# Patient Record
Sex: Female | Born: 1950
Health system: Southern US, Community
[De-identification: ages and names within clinical notes are randomized; demographics above are authoritative.]

## PROBLEM LIST (undated history)

## (undated) DIAGNOSIS — K219 Gastro-esophageal reflux disease without esophagitis: Secondary | ICD-10-CM

## (undated) DIAGNOSIS — E119 Type 2 diabetes mellitus without complications: Secondary | ICD-10-CM

## (undated) DIAGNOSIS — N200 Calculus of kidney: Secondary | ICD-10-CM

## (undated) DIAGNOSIS — D649 Anemia, unspecified: Secondary | ICD-10-CM

## (undated) DIAGNOSIS — E785 Hyperlipidemia, unspecified: Secondary | ICD-10-CM

## (undated) DIAGNOSIS — J45909 Unspecified asthma, uncomplicated: Secondary | ICD-10-CM

## (undated) DIAGNOSIS — I10 Essential (primary) hypertension: Secondary | ICD-10-CM

## (undated) HISTORY — DX: Unspecified asthma, uncomplicated: J45.909

## (undated) HISTORY — DX: Gastro-esophageal reflux disease without esophagitis: K21.9

## (undated) HISTORY — PX: ABDOMINAL HYSTERECTOMY: SHX81

## (undated) HISTORY — DX: Anemia, unspecified: D64.9

## (undated) HISTORY — PX: TUBAL LIGATION: SHX77

## (undated) HISTORY — DX: Essential (primary) hypertension: I10

## (undated) HISTORY — PX: TONSILLECTOMY: SUR1361

## (undated) HISTORY — DX: Hyperlipidemia, unspecified: E78.5

## (undated) HISTORY — DX: Type 2 diabetes mellitus without complications: E11.9

---

## 1998-06-07 ENCOUNTER — Ambulatory Visit (HOSPITAL_COMMUNITY): Admission: RE | Admit: 1998-06-07 | Discharge: 1998-06-07 | Payer: Self-pay | Admitting: Family Medicine

## 2004-05-10 ENCOUNTER — Other Ambulatory Visit: Admission: RE | Admit: 2004-05-10 | Discharge: 2004-05-10 | Payer: Self-pay | Admitting: Obstetrics and Gynecology

## 2004-05-17 ENCOUNTER — Encounter: Admission: RE | Admit: 2004-05-17 | Discharge: 2004-05-17 | Payer: Self-pay | Admitting: Family Medicine

## 2012-01-22 ENCOUNTER — Encounter: Payer: Self-pay | Admitting: *Deleted

## 2013-09-30 ENCOUNTER — Emergency Department (HOSPITAL_COMMUNITY): Payer: BC Managed Care – PPO

## 2013-09-30 ENCOUNTER — Encounter (HOSPITAL_COMMUNITY): Payer: Self-pay | Admitting: Emergency Medicine

## 2013-09-30 ENCOUNTER — Emergency Department (HOSPITAL_COMMUNITY)
Admission: EM | Admit: 2013-09-30 | Discharge: 2013-10-01 | Disposition: A | Discharge status: Home / Self Care | Payer: BC Managed Care – PPO | Attending: Emergency Medicine | Admitting: Emergency Medicine

## 2013-09-30 DIAGNOSIS — Z79899 Other long term (current) drug therapy: Secondary | ICD-10-CM | POA: Insufficient documentation

## 2013-09-30 DIAGNOSIS — Y9389 Activity, other specified: Secondary | ICD-10-CM | POA: Insufficient documentation

## 2013-09-30 DIAGNOSIS — S0083XA Contusion of other part of head, initial encounter: Secondary | ICD-10-CM | POA: Insufficient documentation

## 2013-09-30 DIAGNOSIS — Z23 Encounter for immunization: Secondary | ICD-10-CM | POA: Insufficient documentation

## 2013-09-30 DIAGNOSIS — S1093XA Contusion of unspecified part of neck, initial encounter: Secondary | ICD-10-CM

## 2013-09-30 DIAGNOSIS — S060X9A Concussion with loss of consciousness of unspecified duration, initial encounter: Secondary | ICD-10-CM | POA: Insufficient documentation

## 2013-09-30 DIAGNOSIS — T07XXXA Unspecified multiple injuries, initial encounter: Secondary | ICD-10-CM

## 2013-09-30 DIAGNOSIS — S40029A Contusion of unspecified upper arm, initial encounter: Secondary | ICD-10-CM | POA: Insufficient documentation

## 2013-09-30 DIAGNOSIS — S8010XA Contusion of unspecified lower leg, initial encounter: Secondary | ICD-10-CM | POA: Insufficient documentation

## 2013-09-30 DIAGNOSIS — S0003XA Contusion of scalp, initial encounter: Secondary | ICD-10-CM | POA: Insufficient documentation

## 2013-09-30 DIAGNOSIS — Y929 Unspecified place or not applicable: Secondary | ICD-10-CM | POA: Insufficient documentation

## 2013-09-30 DIAGNOSIS — S51809A Unspecified open wound of unspecified forearm, initial encounter: Secondary | ICD-10-CM | POA: Insufficient documentation

## 2013-09-30 LAB — CBC WITH DIFFERENTIAL/PLATELET
BASOS ABS: 0 10*3/uL (ref 0.0–0.1)
Basophils Relative: 0 % (ref 0–1)
Eosinophils Absolute: 0 10*3/uL (ref 0.0–0.7)
Eosinophils Relative: 0 % (ref 0–5)
HCT: 39.3 % (ref 36.0–46.0)
HEMOGLOBIN: 12.8 g/dL (ref 12.0–15.0)
LYMPHS ABS: 1 10*3/uL (ref 0.7–4.0)
LYMPHS PCT: 7 % — AB (ref 12–46)
MCH: 28.5 pg (ref 26.0–34.0)
MCHC: 32.6 g/dL (ref 30.0–36.0)
MCV: 87.5 fL (ref 78.0–100.0)
MONOS PCT: 7 % (ref 3–12)
Monocytes Absolute: 1 10*3/uL (ref 0.1–1.0)
NEUTROS ABS: 13 10*3/uL — AB (ref 1.7–7.7)
NEUTROS PCT: 86 % — AB (ref 43–77)
PLATELETS: 227 10*3/uL (ref 150–400)
RBC: 4.49 MIL/uL (ref 3.87–5.11)
RDW: 13.6 % (ref 11.5–15.5)
WBC: 15 10*3/uL — AB (ref 4.0–10.5)

## 2013-09-30 MED ORDER — TETANUS-DIPHTH-ACELL PERTUSSIS 5-2.5-18.5 LF-MCG/0.5 IM SUSP
0.5000 mL | Freq: Once | INTRAMUSCULAR | Status: AC
  Administered 2013-09-30: 0.5 mL via INTRAMUSCULAR
  Filled 2013-09-30: qty 0.5

## 2013-09-30 NOTE — ED Notes (Signed)
Pt placed into a gown and on monitor upon arrival to room. Pt monitored by 5 lead, blood pressure, and pulse ox.

## 2013-09-30 NOTE — ED Notes (Signed)
Reports "the horse went one way and I went another."  Was thrown into fence.  Reports waking up on

## 2013-09-30 NOTE — ED Notes (Signed)
Pt placed back on monitor  Upon arrival to room from radiology. Pt monitored by 5 lead, blood pressure, and pulse ox.

## 2013-09-30 NOTE — ED Notes (Signed)
Pt was riding a horse and was thrown off into a fence. Pt states she did hit her head and had positive LOC. Pt was able to ambulate into the ED. Pt with right arm hematoma, left lower leg hematoma, hematoma to back of head, and left abrasion to left shoulder. Pt is able to move all extremities and place pressure on all. GCS 15.

## 2013-10-01 LAB — BASIC METABOLIC PANEL
Anion gap: 16 — ABNORMAL HIGH (ref 5–15)
BUN: 22 mg/dL (ref 6–23)
CALCIUM: 9.1 mg/dL (ref 8.4–10.5)
CHLORIDE: 101 meq/L (ref 96–112)
CO2: 24 meq/L (ref 19–32)
CREATININE: 0.77 mg/dL (ref 0.50–1.10)
GFR calc non Af Amer: 89 mL/min — ABNORMAL LOW (ref >90)
Glucose, Bld: 104 mg/dL — ABNORMAL HIGH (ref 70–99)
Potassium: 4.1 mEq/L (ref 3.7–5.3)
Sodium: 141 mEq/L (ref 137–147)

## 2013-10-01 MED ORDER — OXYCODONE-ACETAMINOPHEN 5-325 MG PO TABS: 1.0000 | ORAL_TABLET | ORAL | Status: DC | PRN

## 2013-10-01 MED ORDER — ONDANSETRON 8 MG PO TBDP: 8.0000 mg | ORAL_TABLET | Freq: Three times a day (TID) | ORAL | Status: DC | PRN

## 2013-10-01 MED ORDER — OXYCODONE-ACETAMINOPHEN 5-325 MG PO TABS
2.0000 | ORAL_TABLET | Freq: Once | ORAL | Status: AC
  Administered 2013-10-01: 2 via ORAL
  Filled 2013-10-01: qty 2

## 2013-10-01 NOTE — Discharge Instructions (Signed)
Hematoma A hematoma is a collection of blood under the skin, in an organ, in a body space, in a joint space, or in other tissue. The blood can clot to form a lump that you can see and feel. The lump is often firm and may sometimes become sore and tender. Most hematomas get better in a few days to weeks. However, some hematomas may be serious and require medical care. Hematomas can range in size from very small to very large. CAUSES  A hematoma can be caused by a blunt or penetrating injury. It can also be caused by spontaneous leakage from a blood vessel under the skin. Spontaneous leakage from a blood vessel is more likely to occur in older people, especially those taking blood thinners. Sometimes, a hematoma can develop after certain medical procedures. SIGNS AND SYMPTOMS   A firm lump on the body.  Possible pain and tenderness in the area.  Bruising.Blue, dark blue, purple-red, or yellowish skin may appear at the site of the hematoma if the hematoma is close to the surface of the skin. For hematomas in deeper tissues or body spaces, the signs and symptoms may be subtle. For example, an intra-abdominal hematoma may cause abdominal pain, weakness, fainting, and shortness of breath. An intracranial hematoma may cause a headache or symptoms such as weakness, trouble speaking, or a change in consciousness. DIAGNOSIS  A hematoma can usually be diagnosed based on your medical history and a physical exam. Imaging tests may be needed if your health care provider suspects a hematoma in deeper tissues or body spaces, such as the abdomen, head, or chest. These tests may include ultrasonography or a CT scan.  TREATMENT  Hematomas usually go away on their own over time. Rarely does the blood need to be drained out of the body. Large hematomas or those that may affect vital organs will sometimes need surgical drainage or monitoring. HOME CARE INSTRUCTIONS   Apply ice to the injured area:   Put ice in a  plastic bag.   Place a towel between your skin and the bag.   Leave the ice on for 20 minutes, 2-3 times a day for the first 1 to 2 days.   After the first 2 days, switch to using warm compresses on the hematoma.   Elevate the injured area to help decrease pain and swelling. Wrapping the area with an elastic bandage may also be helpful. Compression helps to reduce swelling and promotes shrinking of the hematoma. Make sure the bandage is not wrapped too tight.   If your hematoma is on a lower extremity and is painful, crutches may be helpful for a couple days.   Only take over-the-counter or prescription medicines as directed by your health care provider. SEEK IMMEDIATE MEDICAL CARE IF:   You have increasing pain, or your pain is not controlled with medicine.   You have a fever.   You have worsening swelling or discoloration.   Your skin over the hematoma breaks or starts bleeding.   Your hematoma is in your chest or abdomen and you have weakness, shortness of breath, or a change in consciousness.  Your hematoma is on your scalp (caused by a fall or injury) and you have a worsening headache or a change in alertness or consciousness. MAKE SURE YOU:   Understand these instructions.  Will watch your condition.  Will get help right away if you are not doing well or get worse. Document Released: 10/17/2003 Document Revised: 11/04/2012 Document Reviewed: 08/12/2012   [employer] Patient Information 2015 [place]. This information is not intended to replace advice given to you by your health care provider. Make sure you discuss any questions you have with your health care provider. Abrasion An abrasion is a cut or scrape of the skin. Abrasions do not extend through all layers of the skin and most heal within 10 days. It is important to care for your abrasion properly to prevent infection. CAUSES  Most abrasions are caused by falling on, or gliding across, the ground or other  surface. When your skin rubs on something, the outer and inner layer of skin rubs off, causing an abrasion. DIAGNOSIS  Your caregiver will be able to diagnose an abrasion during a physical exam.  TREATMENT  Your treatment depends on how large and deep the abrasion is. Generally, your abrasion will be cleaned with water and a mild soap to remove any dirt or debris. An antibiotic ointment may be put over the abrasion to prevent an infection. A bandage (dressing) may be wrapped around the abrasion to keep it from getting dirty.  You may need a tetanus shot if:  You cannot remember when you had your last tetanus shot.  You have never had a tetanus shot.  The injury broke your skin. If you get a tetanus shot, your arm may swell, get red, and feel warm to the touch. This is common and not a problem. If you need a tetanus shot and you choose not to have one, there is a rare chance of getting tetanus. Sickness from tetanus can be serious.  HOME CARE INSTRUCTIONS   If a dressing was applied, change it at least once a day or as directed by your caregiver. If the bandage sticks, soak it off with warm water.   Wash the area with water and a mild soap to remove all the ointment 2 times a day. Rinse off the soap and pat the area dry with a clean towel.   Reapply any ointment as directed by your caregiver. This will help prevent infection and keep the bandage from sticking. Use gauze over the wound and under the dressing to help keep the bandage from sticking.   Change your dressing right away if it becomes wet or dirty.   Only take over-the-counter or prescription medicines for pain, discomfort, or fever as directed by your caregiver.   Follow up with your caregiver within 24-48 hours for a wound check, or as directed. If you were not given a wound-check appointment, look closely at your abrasion for redness, swelling, or pus. These are signs of infection. SEEK IMMEDIATE MEDICAL CARE IF:   You  have increasing pain in the wound.   You have redness, swelling, or tenderness around the wound.   You have pus coming from the wound.   You have a fever or persistent symptoms for more than 2-3 days.  You have a fever and your symptoms suddenly get worse.  You have a bad smell coming from the wound or dressing.  MAKE SURE YOU:   Understand these instructions.  Will watch your condition.  Will get help right away if you are not doing well or get worse. Document Released: 12/12/2004 Document Revised: 02/19/2012 Document Reviewed: 02/05/2011 [facility] Patient Information 2015 [place], [place]. This information is not intended to replace advice given to you by your health care provider. Make sure you discuss any questions you have with your health care provider. Skin Tear Care A skin tear is a wound in  which the top layer of skin has peeled off. This is a common problem with aging because the skin becomes thinner and more fragile as a person gets older. In addition, some medicines, such as oral corticosteroids, can lead to skin thinning if taken for long periods of time.  A skin tear is often repaired with tape or skin adhesive strips. This keeps the skin that has been peeled off in contact with the healthier skin beneath. Depending on the location of the wound, a bandage (dressing) may be applied over the tape or skin adhesive strips. Sometimes, during the healing process, the skin turns black and dies. Even when this happens, the torn skin acts as a good dressing until the skin underneath gets healthier and repairs itself. HOME CARE INSTRUCTIONS   Change dressings once per day or as directed by your caregiver.  Gently clean the skin tear and the area around the tear using saline solution or mild soap and water.  Do not rub the injured skin dry. Let the area air dry.  Apply petroleum jelly or an antibiotic cream or ointment to keep the tear moist. This will help the wound heal. Do not  allow a scab to form.  If the dressing sticks before the next dressing change, moisten it with warm soapy water and gently remove it.  Protect the injured skin until it has healed.  Only take over-the-counter or prescription medicines as directed by your caregiver.  Take showers or baths using warm soapy water. Apply a new dressing after the shower or bath.  Keep all follow-up appointments as directed by your caregiver.  SEEK IMMEDIATE MEDICAL CARE IF:   You have redness, swelling, or increasing pain in the skin tear.  You havepus coming from the skin tear.  You have chills.  You have a red streak that goes away from the skin tear.  You have a bad smell coming from the tear or dressing.  You have a fever or persistent symptoms for more than 2-3 days.  You have a fever and your symptoms suddenly get worse. MAKE SURE YOU:  Understand these instructions.  Will watch this condition.  Will get help right away if your child is not doing well or gets worse. Document Released: 11/27/2000 Document Revised: 11/27/2011 Document Reviewed: 09/16/2011 [facility] Patient Information 2015 [place], [place]. This information is not intended to replace advice given to you by your health care provider. Make sure you discuss any questions you have with your health care provider. Concussion A concussion, or closed-head injury, is a brain injury caused by a direct blow to the head or by a quick and sudden movement (jolt) of the head or neck. Concussions are usually not life-threatening. Even so, the effects of a concussion can be serious. If you have had a concussion before, you are more likely to experience concussion-like symptoms after a direct blow to the head.  CAUSES  Direct blow to the head, such as from running into another player during a soccer game, being hit in a fight, or hitting your head on a hard surface.  A jolt of the head or neck that causes the brain to move back and forth inside  the skull, such as in a car crash. SIGNS AND SYMPTOMS The signs of a concussion can be hard to notice. Early on, they may be missed by you, family members, and health care providers. You may look fine but act or feel differently. Symptoms are usually temporary, but they may last  for days, weeks, or even longer. Some symptoms may appear right away while others may not show up for hours or days. Every head injury is different. Symptoms include:  Mild to moderate headaches that will not go away.  A feeling of pressure inside your head.  Having more trouble than usual:  Learning or remembering things you have heard.  Answering questions.  Paying attention or concentrating.  Organizing daily tasks.  Making decisions and solving problems.  Slowness in thinking, acting or reacting, speaking, or reading.  Getting lost or being easily confused.  Feeling tired all the time or lacking energy (fatigued).  Feeling drowsy.  Sleep disturbances.  Sleeping more than usual.  Sleeping less than usual.  Trouble falling asleep.  Trouble sleeping (insomnia).  Loss of balance or feeling lightheaded or dizzy.  Nausea or vomiting.  Numbness or tingling.  Increased sensitivity to:  Sounds.  Lights.  Distractions.  Vision problems or eyes that tire easily.  Diminished sense of taste or smell.  Ringing in the ears.  Mood changes such as feeling sad or anxious.  Becoming easily irritated or angry for little or no reason.  Lack of motivation.  Seeing or hearing things other people do not see or hear (hallucinations). DIAGNOSIS Your health care provider can usually diagnose a concussion based on a description of your injury and symptoms. He or she will ask whether you passed out (lost consciousness) and whether you are having trouble remembering events that happened right before and during your injury. Your evaluation might include:  A brain scan to look for signs of injury to  the brain. Even if the test shows no injury, you may still have a concussion.  Blood tests to be sure other problems are not present. TREATMENT  Concussions are usually treated in an emergency department, in urgent care, or at a clinic. You may need to stay in the hospital overnight for further treatment.  Tell your health care provider if you are taking any medicines, including prescription medicines, over-the-counter medicines, and natural remedies. Some medicines, such as blood thinners (anticoagulants) and aspirin, may increase the chance of complications. Also tell your health care provider whether you have had alcohol or are taking illegal drugs. This information may affect treatment.  Your health care provider will send you home with important instructions to follow.  How fast you will recover from a concussion depends on many factors. These factors include how severe your concussion is, what part of your brain was injured, your age, and how healthy you were before the concussion.  Most people with mild injuries recover fully. Recovery can take time. In general, recovery is slower in older persons. Also, persons who have had a concussion in the past or have other medical problems may find that it takes longer to recover from their current injury. HOME CARE INSTRUCTIONS General Instructions  Carefully follow the directions your health care provider gave you.  Only take over-the-counter or prescription medicines for pain, discomfort, or fever as directed by your health care provider.  Take only those medicines that your health care provider has approved.  Do not drink alcohol until your health care provider says you are well enough to do so. Alcohol and certain other drugs may slow your recovery and can put you at risk of further injury.  If it is harder than usual to remember things, write them down.  If you are easily distracted, try to do one thing at a time. For example, do not  try to watch TV while fixing dinner.  Talk with family members or close friends when making important decisions.  Keep all follow-up appointments. Repeated evaluation of your symptoms is recommended for your recovery.  Watch your symptoms and tell others to do the same. Complications sometimes occur after a concussion. Older adults with a brain injury may have a higher risk of serious complications, such as a blood clot on the brain.  Tell your teachers, school nurse, school counselor, coach, athletic trainer, or work Freight forwarder about your injury, symptoms, and restrictions. Tell them about what you can or cannot do. They should watch for:  Increased problems with attention or concentration.  Increased difficulty remembering or learning new information.  Increased time needed to complete tasks or assignments.  Increased irritability or decreased ability to cope with stress.  Increased symptoms.  Rest. Rest helps the brain to heal. Make sure you:  Get plenty of sleep at night. Avoid staying up late at night.  Keep the same bedtime hours on weekends and weekdays.  Rest during the day. Take daytime naps or rest breaks when you feel tired.  Limit activities that require a lot of thought or concentration. These include:  Doing homework or job-related work.  Watching TV.  Working on the computer.  Avoid any situation where there is potential for another head injury (football, hockey, soccer, basketball, martial arts, downhill snow sports and horseback riding). Your condition will get worse every time you experience a concussion. You should avoid these activities until you are evaluated by the appropriate follow-up health care providers. Returning To Your Regular Activities You will need to return to your normal activities slowly, not all at once. You must give your body and brain enough time for recovery.  Do not return to sports or other athletic activities until your health care  provider tells you it is safe to do so.  Ask your health care provider when you can drive, ride a bicycle, or operate heavy machinery. Your ability to react may be slower after a brain injury. Never do these activities if you are dizzy.  Ask your health care provider about when you can return to work or school. Preventing Another Concussion It is very important to avoid another brain injury, especially before you have recovered. In rare cases, another injury can lead to permanent brain damage, brain swelling, or death. The risk of this is greatest during the first 7-10 days after a head injury. Avoid injuries by:  Wearing a seat belt when riding in a car.  Drinking alcohol only in moderation.  Wearing a helmet when biking, skiing, skateboarding, skating, or doing similar activities.  Avoiding activities that could lead to a second concussion, such as contact or recreational sports, until your health care provider says it is okay.  Taking safety measures in your home.  Remove clutter and tripping hazards from floors and stairways.  Use grab bars in bathrooms and handrails by stairs.  Place non-slip mats on floors and in bathtubs.  Improve lighting in dim areas. SEEK MEDICAL CARE IF:  You have increased problems paying attention or concentrating.  You have increased difficulty remembering or learning new information.  You need more time to complete tasks or assignments than before.  You have increased irritability or decreased ability to cope with stress.  You have more symptoms than before. Seek medical care if you have any of the following symptoms for more than 2 weeks after your injury:  Lasting (chronic) headaches.  Dizziness  or balance problems.  Nausea.  Vision problems.  Increased sensitivity to noise or light.  Depression or mood swings.  Anxiety or irritability.  Memory problems.  Difficulty concentrating or paying attention.  Sleep problems.  Feeling  tired all the time. SEEK IMMEDIATE MEDICAL CARE IF:  You have severe or worsening headaches. These may be a sign of a blood clot in the brain.  You have weakness (even if only in one hand, leg, or part of the face).  You have numbness.  You have decreased coordination.  You vomit repeatedly.  You have increased sleepiness.  One pupil is larger than the other.  You have convulsions.  You have slurred speech.  You have increased confusion. This may be a sign of a blood clot in the brain.  You have increased restlessness, agitation, or irritability.  You are unable to recognize people or places.  You have neck pain.  It is difficult to wake you up.  You have unusual behavior changes.  You lose consciousness. MAKE SURE YOU:  Understand these instructions.  Will watch your condition.  Will get help right away if you are not doing well or get worse. Document Released: 05/25/2003 Document Revised: 03/09/2013 Document Reviewed: 09/24/2012 [facility] Patient Information 2015 [place], [place]. This information is not intended to replace advice given to you by your health care provider. Make sure you discuss any questions you have with your health care provider.

## 2013-10-01 NOTE — ED Provider Notes (Signed)
CSN: [id number]     Arrival date & time 09/30/13  2107 History   First MD Initiated Contact with Patient 09/30/13 2129     Chief Complaint  Patient presents with  . Arm Injury  . Leg Injury  . Shoulder Injury     (Consider location/radiation/quality/duration/timing/severity/associated sxs/prior Treatment) HPI  This is a 63 year old female who was riding her horse this evening when she fell. She thinks that part of her hit the fence as well as the ground causing injury. She struck her head and had some loss of consciousness. She is unclear the duration that she was alone. She's not wearing a helmet. After she regained consciousness she was able to walk up the hill to find her husband who then brought her into the emergency department further evaluation. She states that she has swelling and contusion of the back of her head and that upper posterior lateral part of her neck. She is also noted multiple contusions and abrasions. She states her pain is currently a 4/10. She has not had nausea or vomiting. She denies visual change or lateralized deficit. She's not having any chest, abdominal, or back pain. She has a skin tear of the right forearm with swelling and tenderness of the right forearm and upper arm, right shoulder, left shoulder, and pain and contusions of bilateral lower legs. She denies any history of anticoagulant use, aspirin, or bleeding diatheses. She has not sure when her last tetanus shot was. History reviewed. No pertinent past medical history. Past Surgical History  Procedure Laterality Date  . Tubal ligation    . Abdominal hysterectomy    . Cesarean section    . Tonsillectomy     Family History  Problem Relation Age of Onset  . Hyperlipidemia Mother   . Hypertension Father    History  Substance Use Topics  . Smoking status: Never Smoker   . Smokeless tobacco: Not on file  . Alcohol Use: Not on file   OB History   Grav Para Term Preterm Abortions TAB SAB Ect Mult  Living                 Review of Systems  All other systems reviewed and are negative.     Allergies  Review of patient's allergies indicates no known allergies.  Home Medications   Prior to Admission medications   Medication Sig Start Date End Date Taking? Authorizing Provider  acetaminophen (TYLENOL ARTHRITIS PAIN) 650 MG CR tablet Take 650 mg by mouth every 8 (eight) hours as needed.   Yes Historical Provider, MD  BIOTIN PO Take 1 tablet by mouth daily.   Yes Historical Provider, MD  GARCINIA CAMBOGIA-CHROMIUM PO Take 1 tablet by mouth daily.   Yes Historical Provider, MD  ibuprofen (ADVIL,MOTRIN) 200 MG tablet Take 200 mg by mouth every 6 (six) hours as needed. 3 tablets for pain as needed   Yes Historical Provider, MD  Multiple Vitamin (MULTIVITAMIN WITH MINERALS) TABS tablet Take 1 tablet by mouth daily.   Yes Historical Provider, MD   BP 148/78  Pulse 84  Temp(Src) 98.5 F (36.9 C) (Oral)  Resp 16  Ht 5\' 4"  (1.626 m)  Wt 163 lb (73.936 kg)  BMI 27.97 kg/m2  SpO2 95% Physical Exam  Nursing note and vitals reviewed. Constitutional: She is oriented to person, place, and time. She appears well-developed and well-nourished.  HENT:  Head: Normocephalic and atraumatic.  Right Ear: Tympanic membrane and external ear normal.  Left Ear: Tympanic membrane  and external ear normal.  Nose: Nose normal. Right sinus exhibits no maxillary sinus tenderness and no frontal sinus tenderness. Left sinus exhibits no maxillary sinus tenderness and no frontal sinus tenderness.  Mouth/Throat: Oropharynx is clear and moist.  Eyes: Conjunctivae and EOM are normal. Pupils are equal, round, and reactive to light. Right eye exhibits no nystagmus. Left eye exhibits no nystagmus.  Neck: Normal range of motion. Neck supple. No JVD present. No tracheal deviation present.    Swelling, contusion, ttp  Cardiovascular: Normal rate, regular rhythm, normal heart sounds and intact distal pulses.     Pulmonary/Chest: Effort normal and breath sounds normal. No respiratory distress. She exhibits no tenderness.  Abdominal: Soft. Bowel sounds are normal. She exhibits no distension and no mass. There is no tenderness. There is no rebound and no guarding.  Musculoskeletal: Normal range of motion. She exhibits no edema and no tenderness.       Arms: Contusion and abrasion right upper arm and right lower arm with skin tear right lower arm, ttp right shoulder- full arom all joints rue  Contusion left shoulder- full arom lue  Swelling and ttp left and right lower legs without bony ttp- full arom hips, knees, and ankles bilaterally  Neurological: She is alert and oriented to person, place, and time. She has normal strength and normal reflexes. No sensory deficit. She displays a negative Romberg sign. GCS eye subscore is 4. GCS verbal subscore is 5. GCS motor subscore is 6.  Reflex Scores:      Tricep reflexes are 2+ on the right side and 2+ on the left side.      Bicep reflexes are 2+ on the right side and 2+ on the left side.      Brachioradialis reflexes are 2+ on the right side and 2+ on the left side.      Patellar reflexes are 2+ on the right side and 2+ on the left side.      Achilles reflexes are 2+ on the right side and 2+ on the left side. Speech is normal without dysarthria, dysphasia, or aphasia. Muscle strength is 5/5 in bilateral shoulders, elbow flexor and extensors, wrist flexor and extensors, and intrinsic hand muscles. 5/5 bilateral lower extremity hip flexors, extensors, knee flexors and extensors, and ankle dorsi and plantar flexors.    Skin: Skin is warm and dry. No rash noted.  Psychiatric: She has a normal mood and affect. Her behavior is normal. Judgment and thought content normal.    ED Course  Procedures (including critical care time) Labs Review Labs Reviewed  CBC WITH DIFFERENTIAL - Abnormal; Notable for the following:    WBC 15.0 (*)    Neutrophils Relative % 86  (*)    Neutro Abs 13.0 (*)    Lymphocytes Relative 7 (*)    All other components within normal limits  BASIC METABOLIC PANEL  URINALYSIS, ROUTINE W REFLEX MICROSCOPIC    Imaging Review Dg Chest 2 View  09/30/2013   CLINICAL DATA:  Fall from horse, pain.  EXAM: CHEST  2 VIEW  COMPARISON:  None.  FINDINGS: Cardiomediastinal silhouette is unremarkable. The lungs are clear without pleural effusions or focal consolidations. Trachea projects midline and there is no pneumothorax. Soft tissue planes and included osseous structures are non-suspicious. Mild degenerative change of the thoracic spine.  IMPRESSION: No active cardiopulmonary disease.   Electronically Signed   By: Elon Alas   On: 09/30/2013 23:05   Dg Pelvis 1-2 Views  09/30/2013  CLINICAL DATA:  Fall from horse, pain.  EXAM: PELVIS - 1-2 VIEW  COMPARISON:  None.  FINDINGS: There is no evidence of pelvic fracture or diastasis. Mild sacroiliac osteoarthrosis. No other pelvic bone lesions are seen.  IMPRESSION: Negative.   Electronically Signed   By: Elon Alas   On: 09/30/2013 23:03   Dg Forearm Right  09/30/2013   CLINICAL DATA:  Pain post trauma  EXAM: RIGHT FOREARM - 2 VIEW  COMPARISON:  None.  FINDINGS: Frontal and lateral views were obtained. There is no fracture or dislocation. Joint spaces appear intact. No erosive change.  IMPRESSION: No fracture or dislocation.   Electronically Signed   By: Lowella Grip M.D.   On: 09/30/2013 22:59   Dg Tibia/fibula Left  09/30/2013   CLINICAL DATA:  Fall from horse, pain.  EXAM: LEFT TIBIA AND FIBULA - 2 VIEW  COMPARISON:  None.  FINDINGS: There is no evidence of fracture or other focal bone lesions. Mild medial knee compartment osteoarthrosis, incompletely evaluated. Soft tissues are unremarkable.  IMPRESSION: No acute fracture deformity or dislocation.   Electronically Signed   By: Elon Alas   On: 09/30/2013 23:03   Dg Tibia/fibula Right  09/30/2013   CLINICAL DATA:   Pain post trauma  EXAM: RIGHT TIBIA AND FIBULA - 2 VIEW  COMPARISON:  None.  FINDINGS: Frontal and lateral views were obtained. No fracture or dislocation. There is osteoarthritic change in the knee and ankle joints. No abnormal periosteal reaction.  IMPRESSION: No fracture or dislocation.   Electronically Signed   By: Lowella Grip M.D.   On: 09/30/2013 23:01   Ct Head Wo Contrast  09/30/2013   CLINICAL DATA:  Pain post trauma  EXAM: CT HEAD WITHOUT CONTRAST  CT CERVICAL SPINE WITHOUT CONTRAST  TECHNIQUE: Multidetector CT imaging of the head and cervical spine was performed following the standard protocol without intravenous contrast. Multiplanar CT image reconstructions of the cervical spine were also generated.  COMPARISON:  None.  FINDINGS: CT HEAD FINDINGS  The ventricles are normal in size and configuration. There is no mass, hemorrhage, extra-axial fluid collection or midline shift. The gray-white compartments appear normal. Bony calvarium appears intact. The mastoid air cells are clear.  CT CERVICAL SPINE FINDINGS  There is no fracture or spondylolisthesis. Prevertebral soft tissues and predental space regions are normal. There is moderately severe disc space narrowing at C6-7. There is mild disc space narrowing at C3-4 and C4-5. There are prominent anterior osteophytes at multiple levels. There is multilevel facet arthropathy. No disc extrusion or stenosis. There is exit foraminal narrowing at several levels bilaterally, most notably at C3-4 on the left, C4-5 on the left, and C6-7 on the left.  IMPRESSION: CT head:  Study within normal limits.  CT cervical spine: Multilevel osteoarthritic change. No fracture or spondylolisthesis.   Electronically Signed   By: Lowella Grip M.D.   On: 09/30/2013 22:24   Ct Cervical Spine Wo Contrast  09/30/2013   CLINICAL DATA:  Pain post trauma  EXAM: CT HEAD WITHOUT CONTRAST  CT CERVICAL SPINE WITHOUT CONTRAST  TECHNIQUE: Multidetector CT imaging of the head  and cervical spine was performed following the standard protocol without intravenous contrast. Multiplanar CT image reconstructions of the cervical spine were also generated.  COMPARISON:  None.  FINDINGS: CT HEAD FINDINGS  The ventricles are normal in size and configuration. There is no mass, hemorrhage, extra-axial fluid collection or midline shift. The gray-white compartments appear normal. Bony calvarium appears intact. The  mastoid air cells are clear.  CT CERVICAL SPINE FINDINGS  There is no fracture or spondylolisthesis. Prevertebral soft tissues and predental space regions are normal. There is moderately severe disc space narrowing at C6-7. There is mild disc space narrowing at C3-4 and C4-5. There are prominent anterior osteophytes at multiple levels. There is multilevel facet arthropathy. No disc extrusion or stenosis. There is exit foraminal narrowing at several levels bilaterally, most notably at C3-4 on the left, C4-5 on the left, and C6-7 on the left.  IMPRESSION: CT head:  Study within normal limits.  CT cervical spine: Multilevel osteoarthritic change. No fracture or spondylolisthesis.   Electronically Signed   By: Lowella Grip M.D.   On: 09/30/2013 22:24   Dg Shoulder Left  09/30/2013   CLINICAL DATA:  Fall from horse.  EXAM: LEFT SHOULDER - 2+ VIEW  COMPARISON:  None.  FINDINGS: There is no evidence of fracture or dislocation. There is no evidence of arthropathy or other focal bone abnormality. Soft tissues are unremarkable.  IMPRESSION: Negative.   Electronically Signed   By: Elon Alas   On: 09/30/2013 23:02   Dg Humerus Right  09/30/2013   CLINICAL DATA:  Fall from horse, pain.  EXAM: RIGHT HUMERUS - 2+ VIEW  COMPARISON:  None.  FINDINGS: There is no evidence of fracture or other focal bone lesions. No dislocation. Punctate calcification at greater tuberosity can be seen with calcific tendinopathy, incompletely evaluated. Soft tissues are unremarkable.  IMPRESSION: No acute  fracture deformity or dislocation.   Electronically Signed   By: Elon Alas   On: 09/30/2013 23:04     EKG Interpretation None      MDM   Final diagnoses:  Contusion, multiple sites  Fall from horse, initial encounter  Hematoma of neck, initial encounter  Concussion, with loss of consciousness of unspecified duration, initial encounter    Patient advised against working or horseback riding until symptoms better.  RX for percocet and zofran and advised regarding ibuprofen and acetaminophen.     Shaune Pollack, MD 10/01/13 [phone]

## 2013-11-07 ENCOUNTER — Emergency Department (HOSPITAL_COMMUNITY): Payer: BC Managed Care – PPO

## 2013-11-07 ENCOUNTER — Emergency Department (HOSPITAL_COMMUNITY)
Admission: EM | Admit: 2013-11-07 | Discharge: 2013-11-08 | Disposition: A | Discharge status: Home / Self Care | Payer: BC Managed Care – PPO | Attending: Emergency Medicine | Admitting: Emergency Medicine

## 2013-11-07 ENCOUNTER — Encounter (HOSPITAL_COMMUNITY): Payer: Self-pay | Admitting: Emergency Medicine

## 2013-11-07 DIAGNOSIS — IMO0002 Reserved for concepts with insufficient information to code with codable children: Secondary | ICD-10-CM | POA: Diagnosis not present

## 2013-11-07 DIAGNOSIS — R079 Chest pain, unspecified: Secondary | ICD-10-CM

## 2013-11-07 DIAGNOSIS — Y9289 Other specified places as the place of occurrence of the external cause: Secondary | ICD-10-CM | POA: Diagnosis not present

## 2013-11-07 DIAGNOSIS — S298XXA Other specified injuries of thorax, initial encounter: Secondary | ICD-10-CM | POA: Diagnosis not present

## 2013-11-07 DIAGNOSIS — S4980XA Other specified injuries of shoulder and upper arm, unspecified arm, initial encounter: Secondary | ICD-10-CM | POA: Diagnosis not present

## 2013-11-07 DIAGNOSIS — Y9352 Activity, horseback riding: Secondary | ICD-10-CM | POA: Insufficient documentation

## 2013-11-07 DIAGNOSIS — S46909A Unspecified injury of unspecified muscle, fascia and tendon at shoulder and upper arm level, unspecified arm, initial encounter: Secondary | ICD-10-CM | POA: Diagnosis not present

## 2013-11-07 DIAGNOSIS — Z79899 Other long term (current) drug therapy: Secondary | ICD-10-CM | POA: Diagnosis not present

## 2013-11-07 LAB — BASIC METABOLIC PANEL
Anion gap: 11 (ref 5–15)
BUN: 11 mg/dL (ref 6–23)
CHLORIDE: 105 meq/L (ref 96–112)
CO2: 26 mEq/L (ref 19–32)
Calcium: 9.3 mg/dL (ref 8.4–10.5)
Creatinine, Ser: 0.66 mg/dL (ref 0.50–1.10)
GFR calc non Af Amer: >90 mL/min (ref >90)
Glucose, Bld: 98 mg/dL (ref 70–99)
POTASSIUM: 3.8 meq/L (ref 3.7–5.3)
SODIUM: 142 meq/L (ref 137–147)

## 2013-11-07 LAB — CBC
HCT: 38.7 % (ref 36.0–46.0)
HEMOGLOBIN: 12.8 g/dL (ref 12.0–15.0)
MCH: 28.6 pg (ref 26.0–34.0)
MCHC: 33.1 g/dL (ref 30.0–36.0)
MCV: 86.6 fL (ref 78.0–100.0)
Platelets: 245 10*3/uL (ref 150–400)
RBC: 4.47 MIL/uL (ref 3.87–5.11)
RDW: 13.5 % (ref 11.5–15.5)
WBC: 9.7 10*3/uL (ref 4.0–10.5)

## 2013-11-07 LAB — I-STAT TROPONIN, ED: TROPONIN I, POC: 0 ng/mL (ref 0.00–0.08)

## 2013-11-07 MED ORDER — DIAZEPAM 5 MG PO TABS
5.0000 mg | ORAL_TABLET | Freq: Once | ORAL | Status: DC
  Filled 2013-11-07: qty 1

## 2013-11-07 MED ORDER — ONDANSETRON HCL 4 MG/2ML IJ SOLN
4.0000 mg | Freq: Once | INTRAMUSCULAR | Status: DC
  Filled 2013-11-07: qty 2

## 2013-11-07 MED ORDER — MORPHINE SULFATE 4 MG/ML IJ SOLN
4.0000 mg | Freq: Once | INTRAMUSCULAR | Status: DC
  Filled 2013-11-07: qty 1

## 2013-11-07 MED ORDER — IOHEXOL 350 MG/ML SOLN
80.0000 mL | Freq: Once | INTRAVENOUS | Status: AC | PRN
  Administered 2013-11-07: 65 mL via INTRAVENOUS

## 2013-11-07 NOTE — ED Notes (Signed)
Presents with generalized chest pressure began last night while sitting in a movie theater. Pt is 5 weeks post fall off a horse-took an aleve when pain began-pain radiates into jaw and is described as uncomfotable and progreesed all day and into this evening. Took one percocet this evening and is unable to lay flat-states, "it feels like there is a lump in my chest"  Chest pain is associated with pain with deep breathing,

## 2013-11-08 MED ORDER — OXYCODONE-ACETAMINOPHEN 5-325 MG PO TABS: 1.0000 | ORAL_TABLET | Freq: Four times a day (QID) | ORAL | Status: DC | PRN

## 2013-11-08 MED ORDER — METHOCARBAMOL 500 MG PO TABS: 500.0000 mg | ORAL_TABLET | Freq: Three times a day (TID) | ORAL | Status: DC | PRN

## 2013-11-08 NOTE — ED Provider Notes (Signed)
CSN: [id number]     Arrival date & time 11/07/13  2119 History   First MD Initiated Contact with Patient 11/07/13 2142     Chief Complaint  Patient presents with  . Chest Pain     (Consider location/radiation/quality/duration/timing/severity/associated sxs/prior Treatment) Patient is a 63 y.o. female presenting with chest pain. The history is provided by the patient.  Chest Pain Pain location:  Substernal area Pain quality: pressure and sharp   Pain radiates to:  Upper back Pain radiates to the back: yes (Radiates from back)   Pain severity:  Moderate Onset quality:  Sudden Timing:  Constant Progression:  Improving Chronicity:  New Context comment:  Recent injury - fell off a horse 5 weeks ago - persistently having back and shoulder pain Relieved by:  Nothing Associated symptoms: no abdominal pain, no cough, no fever, no shortness of breath and not vomiting     History reviewed. No pertinent past medical history. Past Surgical History  Procedure Laterality Date  . Tubal ligation    . Abdominal hysterectomy    . Cesarean section    . Tonsillectomy     Family History  Problem Relation Age of Onset  . Hyperlipidemia Mother   . Hypertension Father    History  Substance Use Topics  . Smoking status: Never Smoker   . Smokeless tobacco: Not on file  . Alcohol Use: Not on file   OB History   Grav Para Term Preterm Abortions TAB SAB Ect Mult Living                 Review of Systems  Constitutional: Negative for fever and chills.  Respiratory: Negative for cough and shortness of breath.   Cardiovascular: Positive for chest pain.  Gastrointestinal: Negative for vomiting and abdominal pain.  All other systems reviewed and are negative.     Allergies  Review of patient's allergies indicates no known allergies.  Home Medications   Prior to Admission medications   Medication Sig Start Date End Date Taking? Authorizing Provider  acetaminophen (TYLENOL ARTHRITIS  PAIN) 650 MG CR tablet Take 650 mg by mouth every 8 (eight) hours as needed for pain.    Yes Historical Provider, MD  GARCINIA CAMBOGIA-CHROMIUM PO Take 1 tablet by mouth daily.   Yes Historical Provider, MD  ibuprofen (ADVIL,MOTRIN) 200 MG tablet Take 200 mg by mouth every 6 (six) hours as needed for moderate pain. 3 tablets for pain as needed   Yes Historical Provider, MD  naproxen sodium (ANAPROX) 220 MG tablet Take 220 mg by mouth daily as needed (for pain).   Yes Historical Provider, MD  oxyCODONE-acetaminophen (PERCOCET/ROXICET) 5-325 MG per tablet Take 1 tablet by mouth every 4 (four) hours as needed for severe pain. 10/01/13  Yes Shaune Pollack, MD  methocarbamol (ROBAXIN) 500 MG tablet Take 1 tablet (500 mg total) by mouth every 8 (eight) hours as needed for muscle spasms. 11/08/13   Evelina Bucy, MD  oxyCODONE-acetaminophen (ROXICET) 5-325 MG per tablet Take 1 tablet by mouth every 6 (six) hours as needed for moderate pain or severe pain. 11/08/13   Evelina Bucy, MD   BP 134/71  Pulse 76  Temp(Src) 99.5 F (37.5 C) (Oral)  Resp 17  SpO2 95% Physical Exam  Nursing note and vitals reviewed. Constitutional: She is oriented to person, place, and time. She appears well-developed and well-nourished. No distress.  HENT:  Head: Normocephalic and atraumatic.  Mouth/Throat: Oropharynx is clear and moist.  Eyes: EOM are normal.  Pupils are equal, round, and reactive to light.  Neck: Normal range of motion. Neck supple.  Cardiovascular: Normal rate and regular rhythm.  Exam reveals no friction rub.   No murmur heard. Pulmonary/Chest: Effort normal and breath sounds normal. No respiratory distress. She has no wheezes. She has no rales.  Abdominal: Soft. She exhibits no distension. There is no tenderness. There is no rebound.  Musculoskeletal: Normal range of motion. She exhibits no edema.  Neurological: She is alert and oriented to person, place, and time. She exhibits normal muscle tone.  Skin:  Skin is warm. No rash noted. She is not diaphoretic.    ED Course  Procedures (including critical care time) Labs Review Labs Reviewed  CBC  BASIC METABOLIC PANEL  I-STAT [place], ED    Imaging Review Ct Angio Chest W/cm &/or Wo Cm  11/07/2013   CLINICAL DATA:  Generalized chest pressure beginning last night. Patient fell off of a horse 5 weeks ago. Pain with deep breathing.  EXAM: CT ANGIOGRAPHY CHEST WITH CONTRAST  TECHNIQUE: Multidetector CT imaging of the chest was performed using the standard protocol during bolus administration of intravenous contrast. Multiplanar CT image reconstructions and MIPs were obtained to evaluate the vascular anatomy.  CONTRAST:  43mL OMNIPAQUE IOHEXOL 350 MG/ML SOLN  COMPARISON:  Chest radiograph 09/30/2013  FINDINGS: Technically adequate study with good opacification of the central and segmental pulmonary arteries. No focal filling defects. No evidence of significant pulmonary embolus. Normal caliber thoracic aorta without evidence of aneurysm. Great vessel origins are patent. No abnormal fluid or soft tissue infiltration in the mediastinum. No significant lymphadenopathy in the chest. Esophagus is decompressed. Normal heart size. Visualized upper abdominal organs demonstrate multiple gallstones with contracted gallbladder.  Visualization of lungs is limited due to motion artifact but there appears to be atelectasis in both lung bases. No pleural effusions. No pneumothorax. Degenerative changes in the spine. No destructive bone lesions. Are  Review of the MIP images confirms the above findings.  IMPRESSION: No evidence of significant pulmonary infarct. No aortic dissection or aneurysm. Atelectasis in the lung bases. Cholelithiasis.   Electronically Signed   By: Lucienne Capers M.D.   On: 11/07/2013 23:08     EKG Interpretation   Date/Time:  Sunday November 07 2013 21:23:02 EDT Ventricular Rate:  93 PR Interval:  112 QRS Duration: 88 QT Interval:  344 QTC  Calculation: 427 R Axis:   50 Text Interpretation:  Normal sinus rhythm Normal ECG No prior for  comparison Confirmed by Mingo Amber  MD, Monti Villers ([id number]) on 11/07/2013 9:44:01 PM      MDM   Final diagnoses:  Chest pain, unspecified chest pain type    63 year old female here with chest and back pain. Back pain began 1 and a movie theater and she tilts her head back. All pain in her bilateral trapezius. She started having some chest pressure last night. Chest pressure has improved, but back pain and chest pain it persisted. Chest pain now sharp and worse with deep breathing. Of note she fell off a horse 5 weeks ago had a negative workup at that time. She tried some pain medicine to home without any relief. No difficulty breathing here. Vitals are stable. No palpable tenderness in her chest, but has a lot of muscle tightness and tenderness in her upper back. Vitals here are stable. With her injuries and pleuritic pain, will scan chest to look for PE, but also look for rib fractures, lung injuries. Labs normal. Troponin is normal. She  stated her chest pressure was constant last night and is now intermittently sharp. Do not feel second troponin is warranted at this time. Patient does not want to stay for second troponin either. Will give muscle relaxers, pain medicine. Instructed to followup with PCP    Evelina Bucy, MD 11/08/13 [phone]

## 2013-11-08 NOTE — Discharge Instructions (Signed)

## 2013-11-11 ENCOUNTER — Ambulatory Visit: Payer: Self-pay | Admitting: Podiatry

## 2013-11-18 ENCOUNTER — Ambulatory Visit: Payer: Self-pay | Admitting: Podiatry

## 2015-05-24 IMAGING — CR DG FOREARM 2V*R*
2 series · 2 of 2 positions shown · non-contrast
Comparison: None.

CLINICAL DATA: Pain post trauma

EXAM:
RIGHT FOREARM - 2 VIEW

[x forearm ap right]
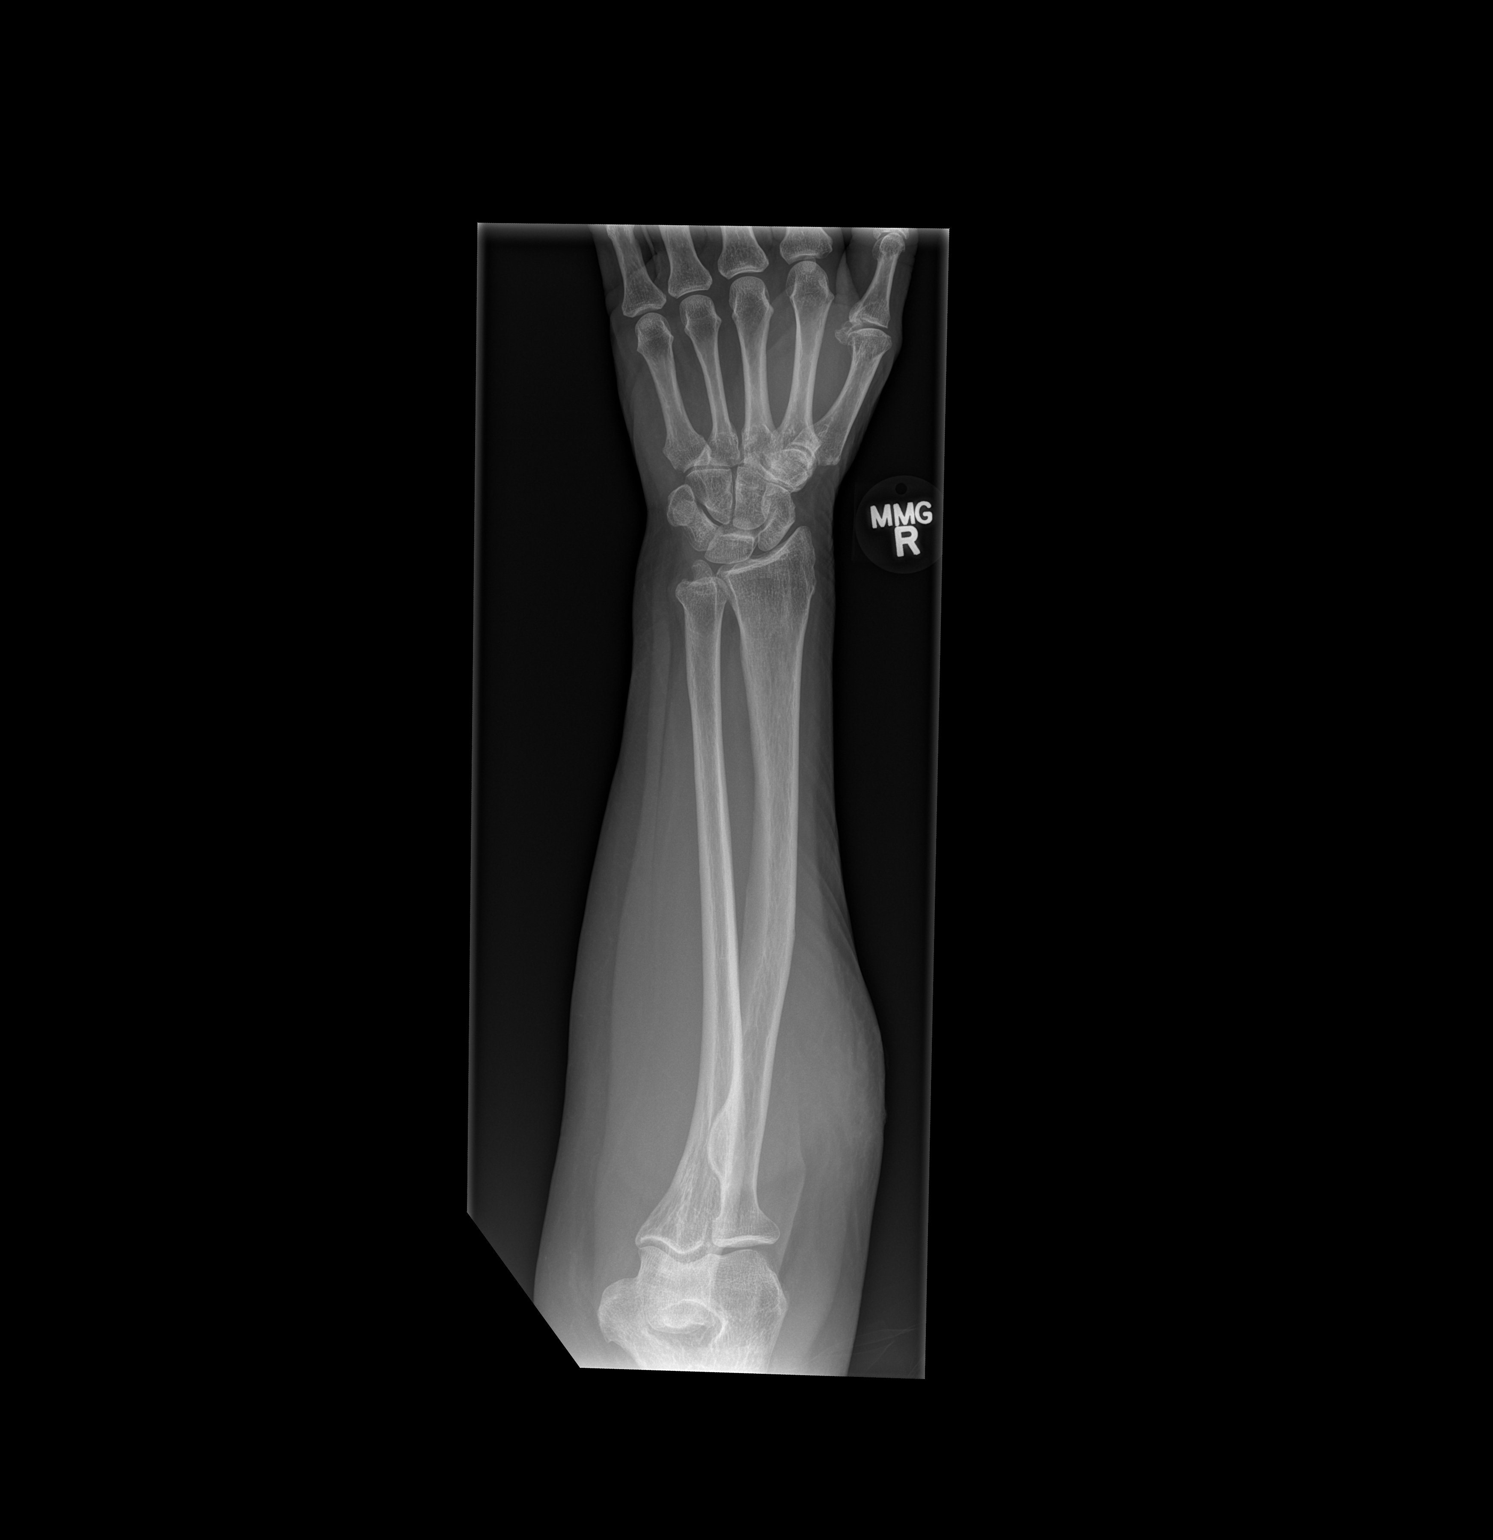

[x forearm lat right]
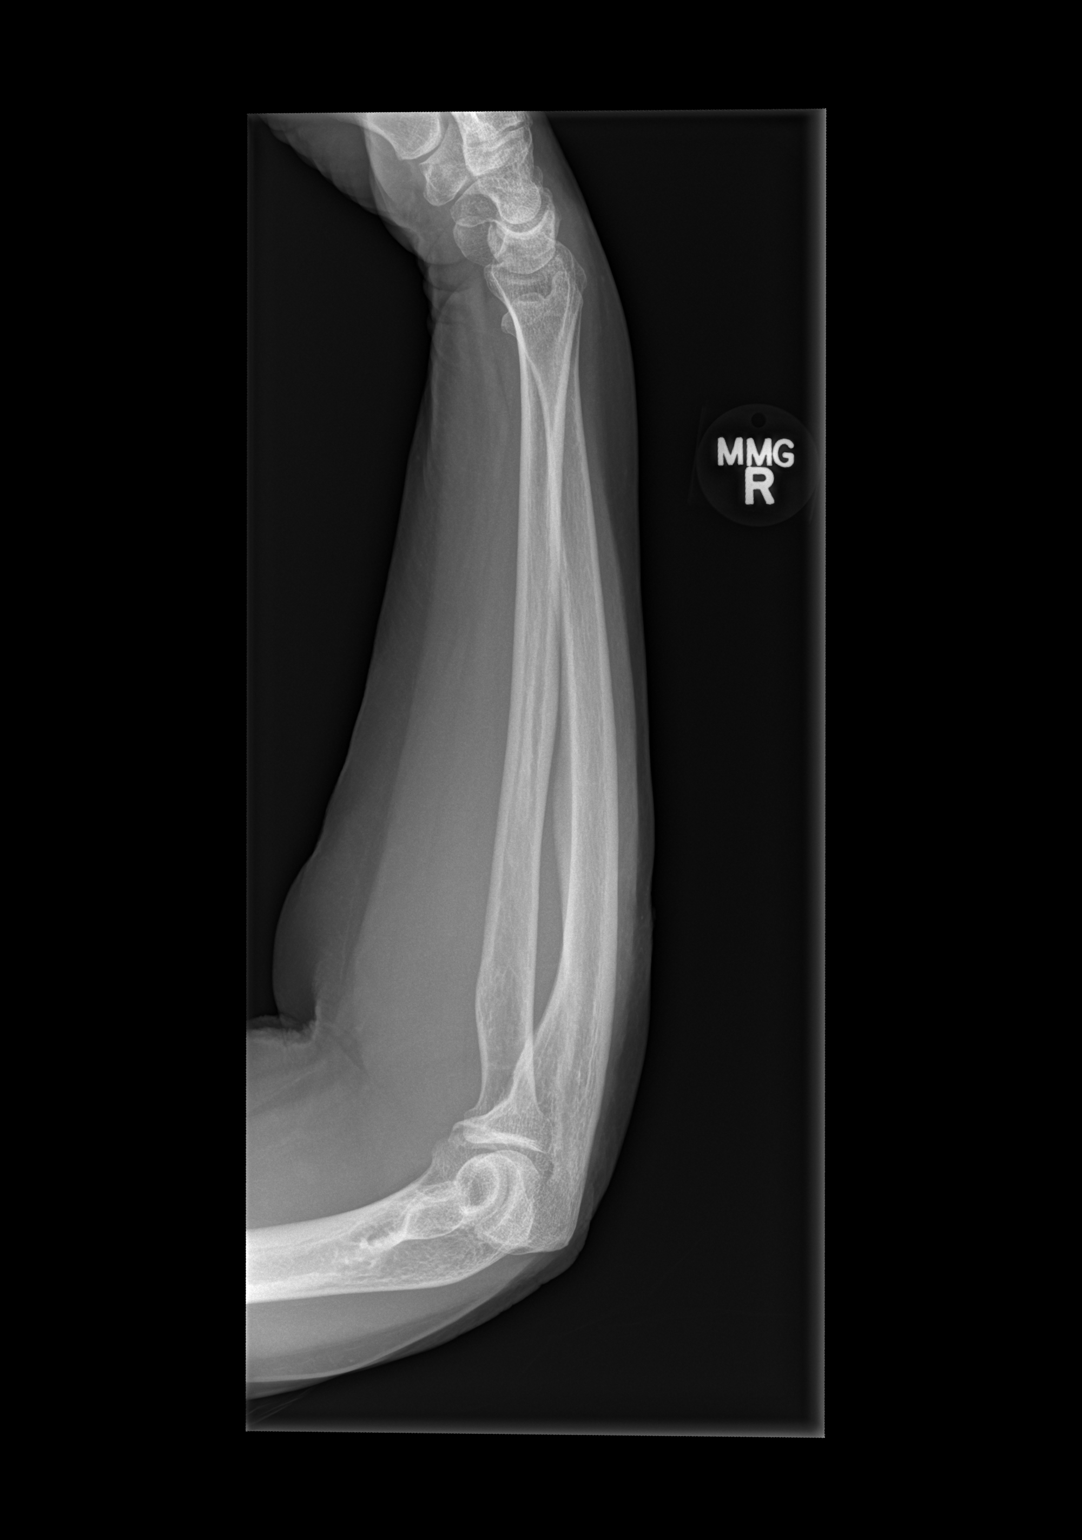

[2 of 2 positions shown; findings below may reference images not displayed]

FINDINGS: Frontal and lateral views were obtained. There is no fracture or
dislocation. Joint spaces appear intact. No erosive change.
IMPRESSION: No fracture or dislocation.

## 2015-05-24 IMAGING — CT CT CERVICAL SPINE W/O CM
3 of 6 series · 10 of 33 positions shown, 12 images · non-contrast
Comparison: None.

CLINICAL DATA: Pain post trauma

EXAM:
CT HEAD WITHOUT CONTRAST
CT CERVICAL SPINE WITHOUT CONTRAST
TECHNIQUE: Multidetector CT imaging of the head and cervical spine was
performed following the standard protocol without intravenous
contrast. Multiplanar CT image reconstructions of the cervical spine
were also generated.

[Series 505: sagittals · sagittal · 0.34mm/px · 5 of 47 slices shown, 6 images]
[im 16/47  bone]
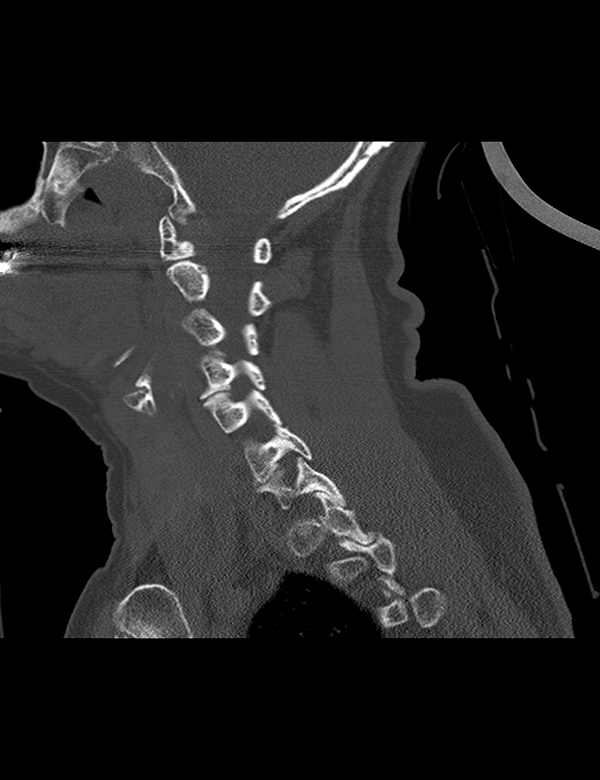
[im 20/47  bone]
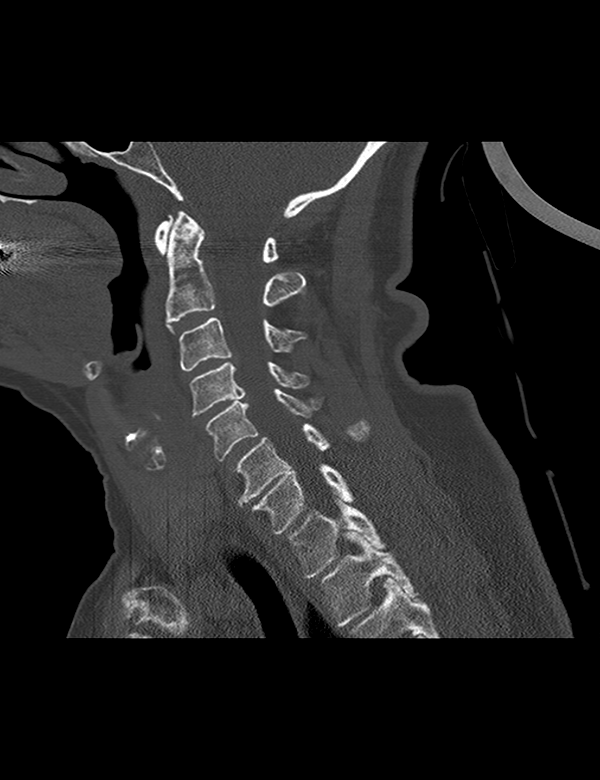
[im 24/47  soft-tissue]
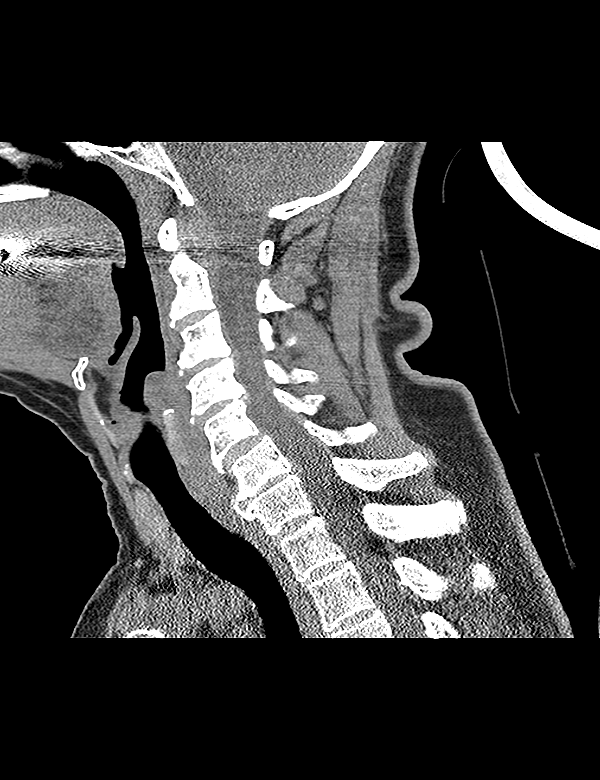
[im 24/47  bone]
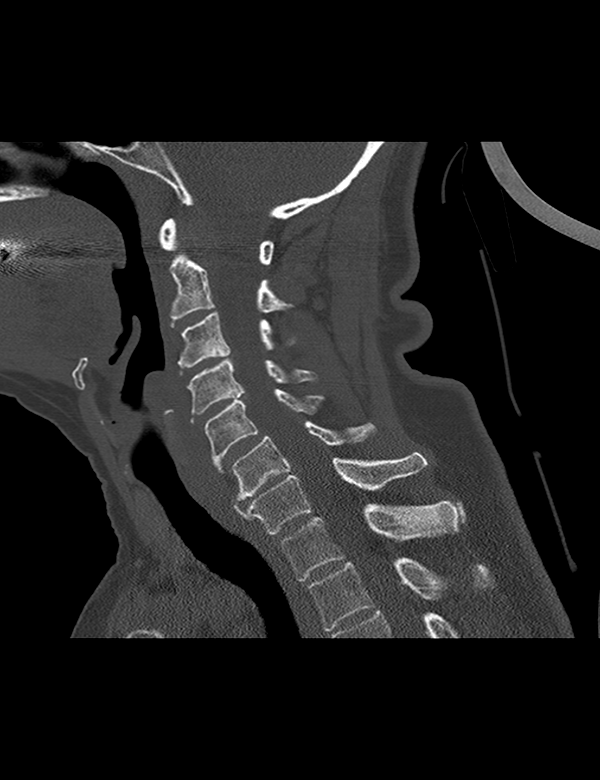
[im 27/47  bone]
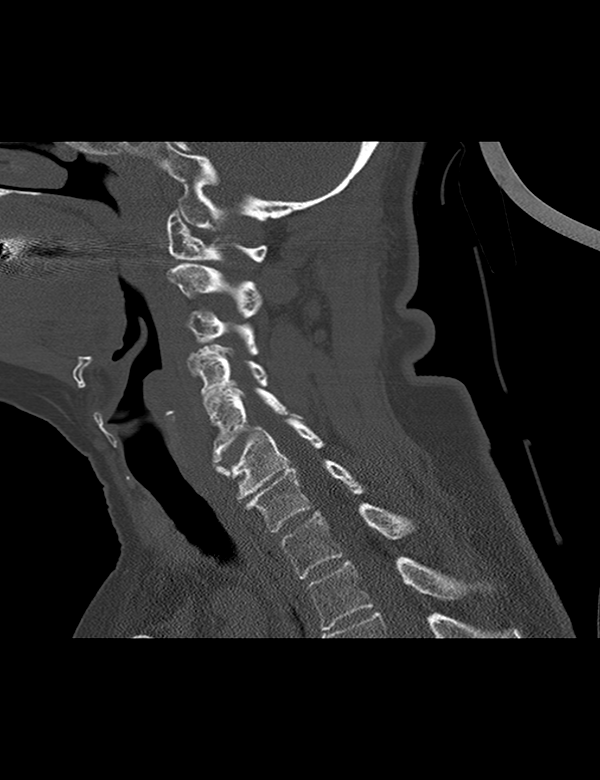
[im 31/47  bone]
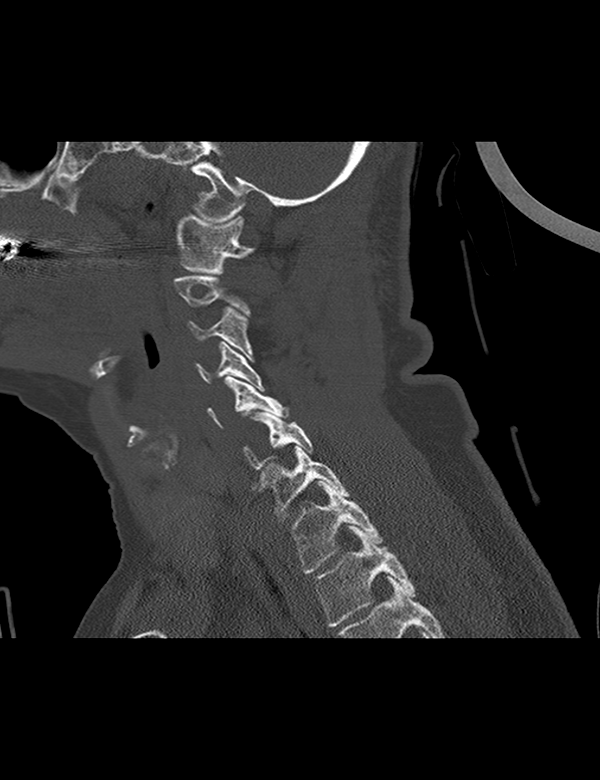

[Series 507: coronals · coronal · 0.34mm/px · 3 of 99 slices shown]
[im 20/99  bone]
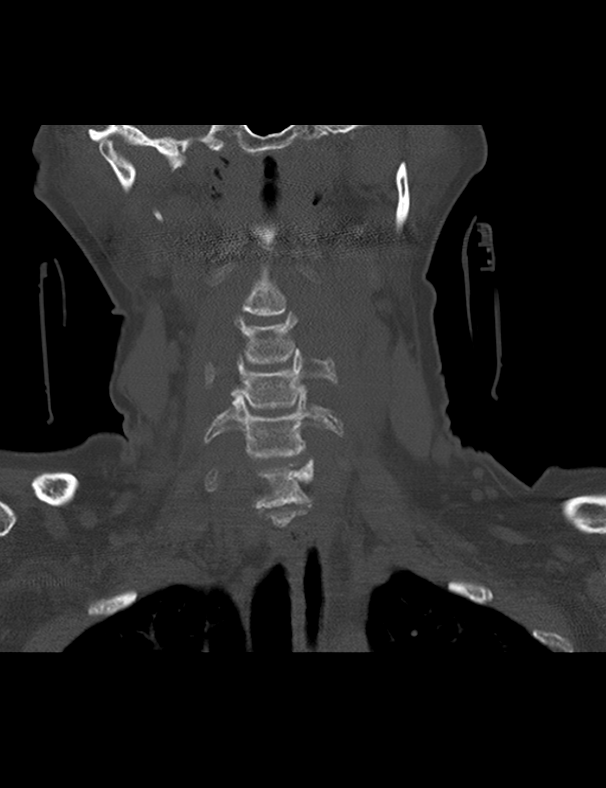
[im 40/99  bone]
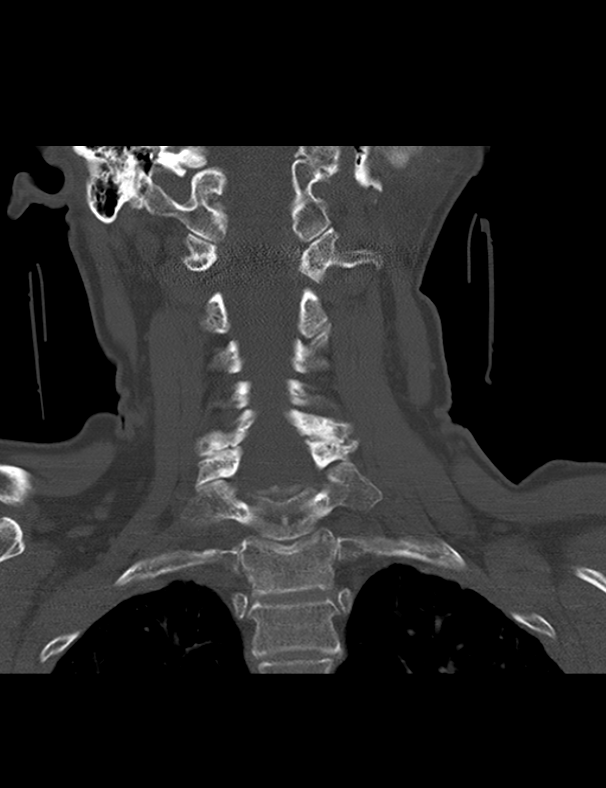
[im 59/99  bone]
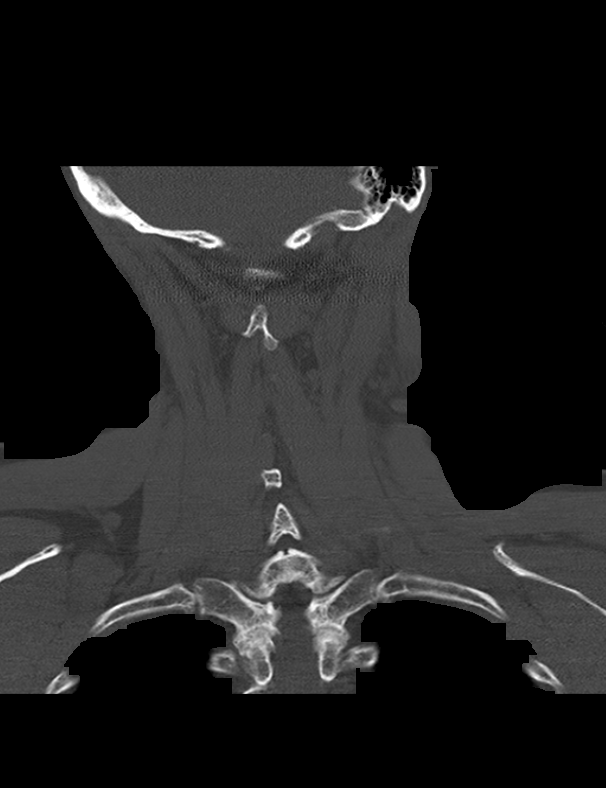

[Series 508: orthogonals · axial · 0.40mm/px · z∈[+108,+145]mm · 2 of 169 slices shown, 3 images]
[im 57/169  soft-tissue]
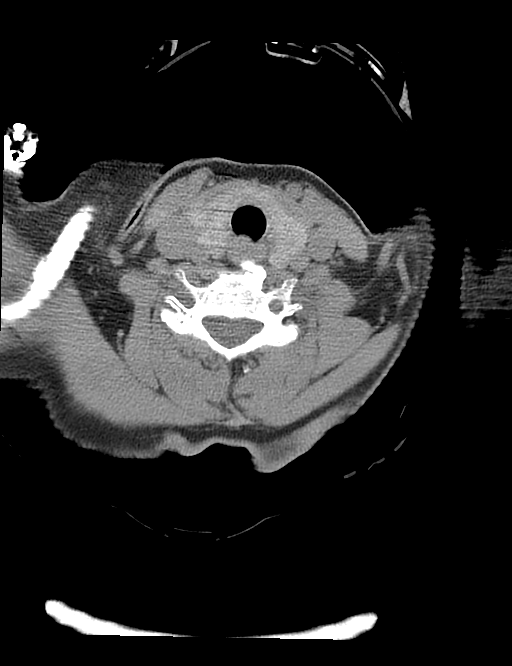
[im 57/169  bone]
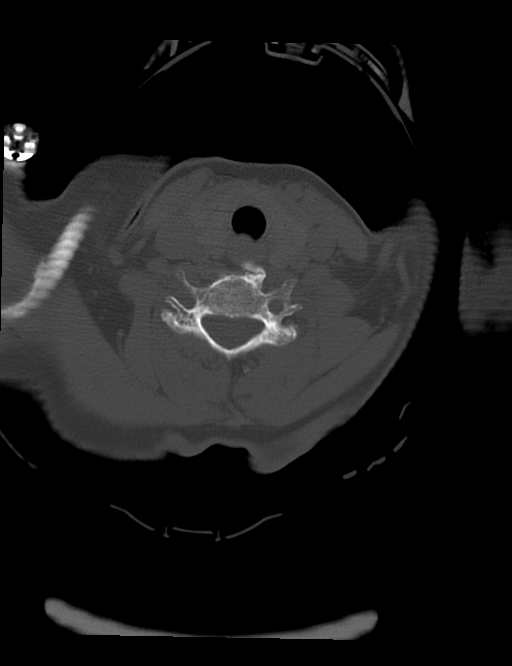
[im 113/169  bone]
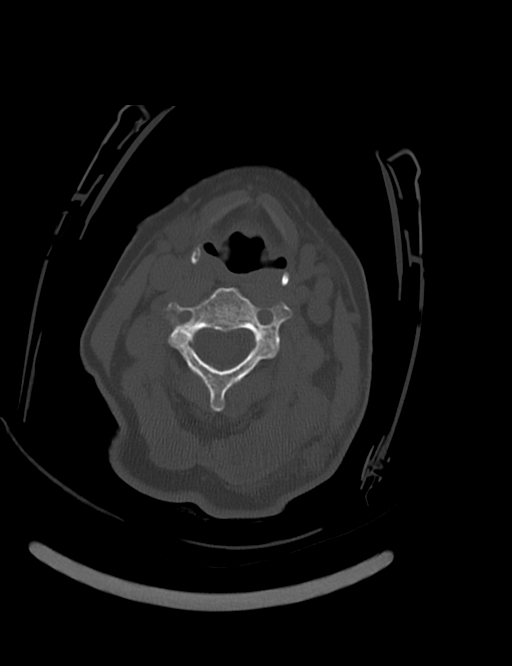

[10 of 33 positions shown; findings below may reference images not displayed]

FINDINGS: CT HEAD FINDINGS

The ventricles are normal in size and configuration. There is no
mass, hemorrhage, extra-axial fluid collection or midline shift. The
gray-white compartments appear normal. Bony calvarium appears
intact. The mastoid air cells are clear.

CT CERVICAL SPINE FINDINGS

There is no fracture or spondylolisthesis. Prevertebral soft tissues
and predental space regions are normal. There is moderately severe
disc space narrowing at C6-7. There is mild disc space narrowing at
C3-4 and C4-5. There are prominent anterior osteophytes at multiple
levels. There is multilevel facet arthropathy. No disc extrusion or
stenosis. There is exit foraminal narrowing at several levels
bilaterally, most notably at C3-4 on the left, C4-5 on the left, and
C6-7 on the left.
IMPRESSION: CT head:  Study within normal limits.

CT cervical spine: Multilevel osteoarthritic change. No fracture or
spondylolisthesis.

## 2015-05-24 IMAGING — CR DG CHEST 2V
2 series · 2 of 2 positions shown · non-contrast
Comparison: None.

CLINICAL DATA: Fall from horse, pain.

EXAM:
CHEST  2 VIEW

[w chest lat]
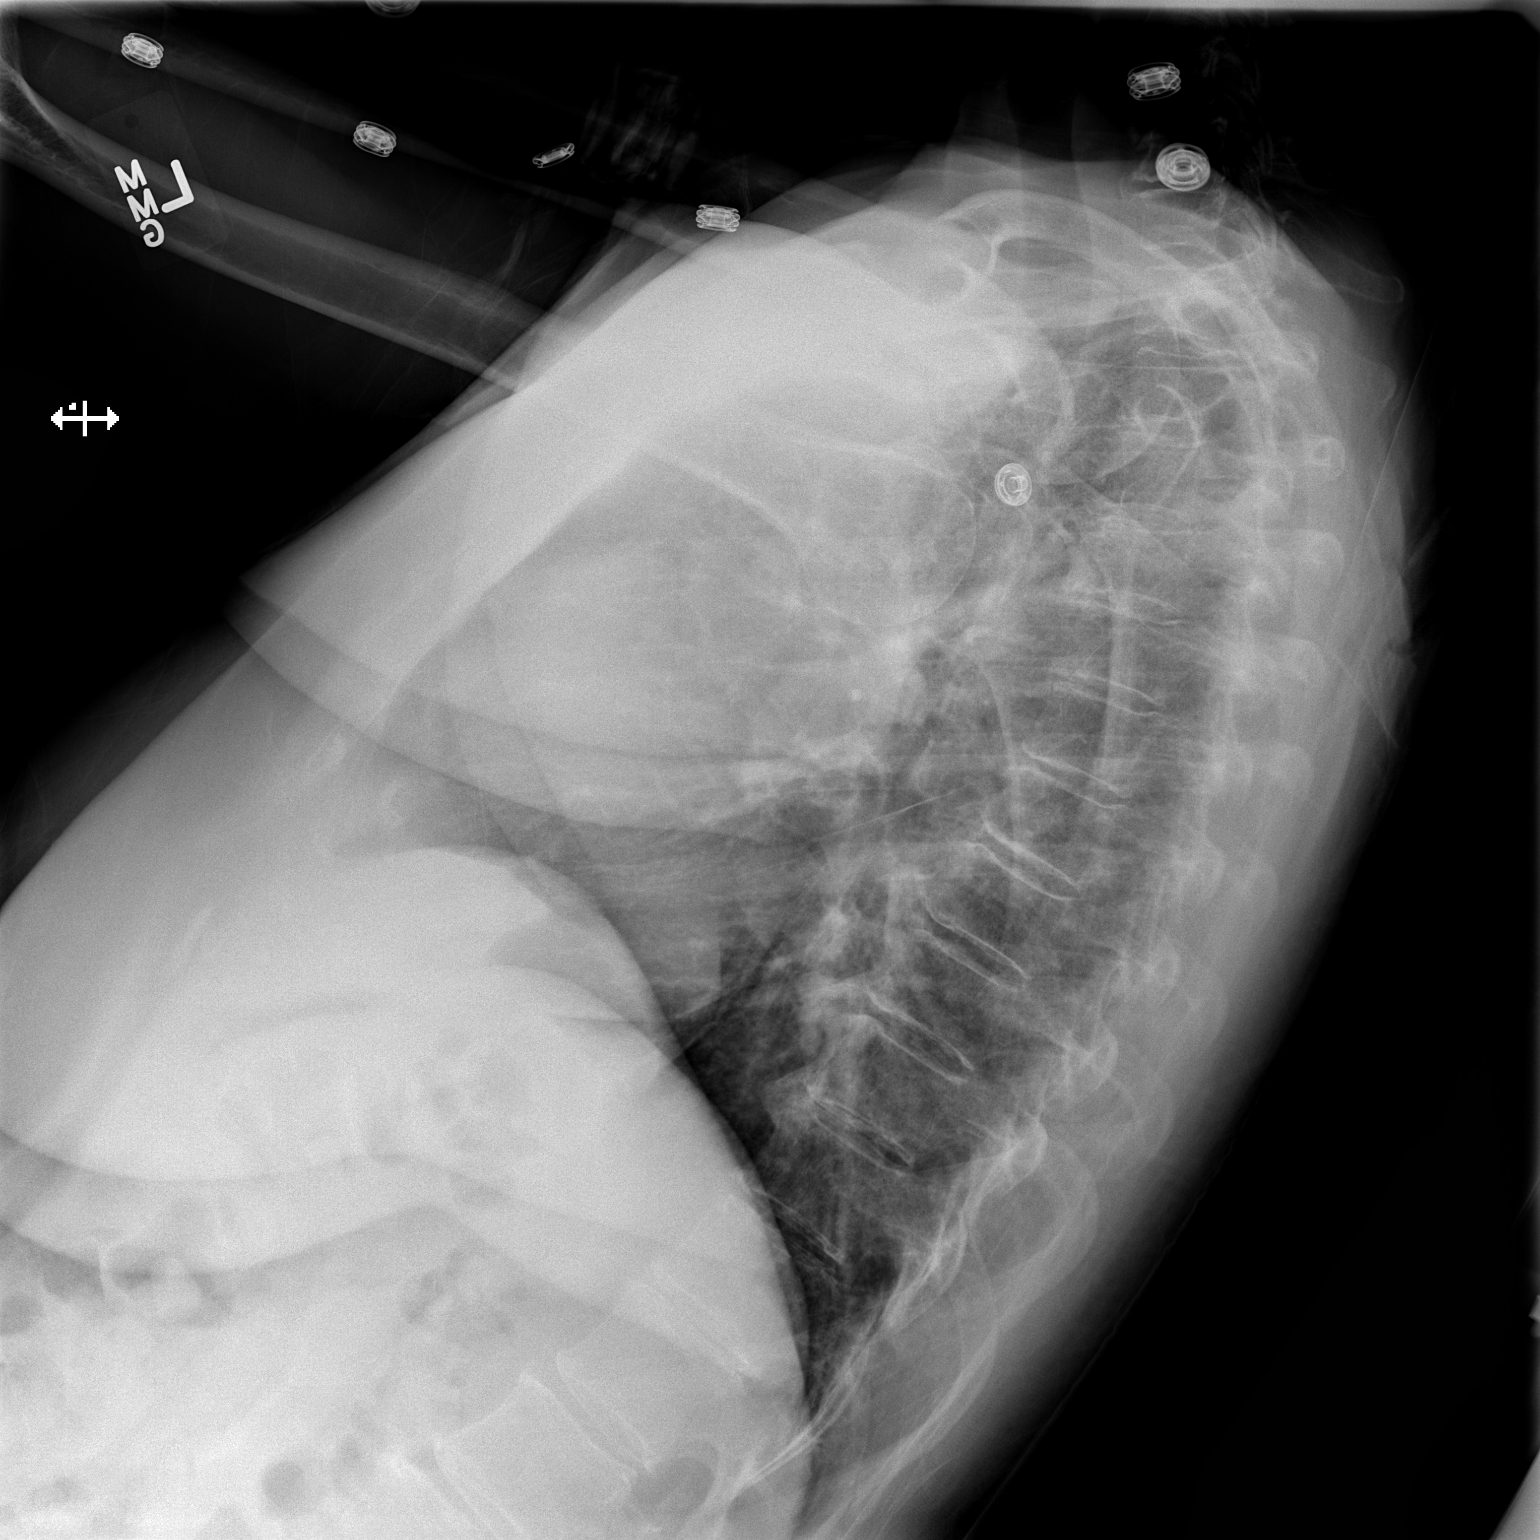

[x chest ap]
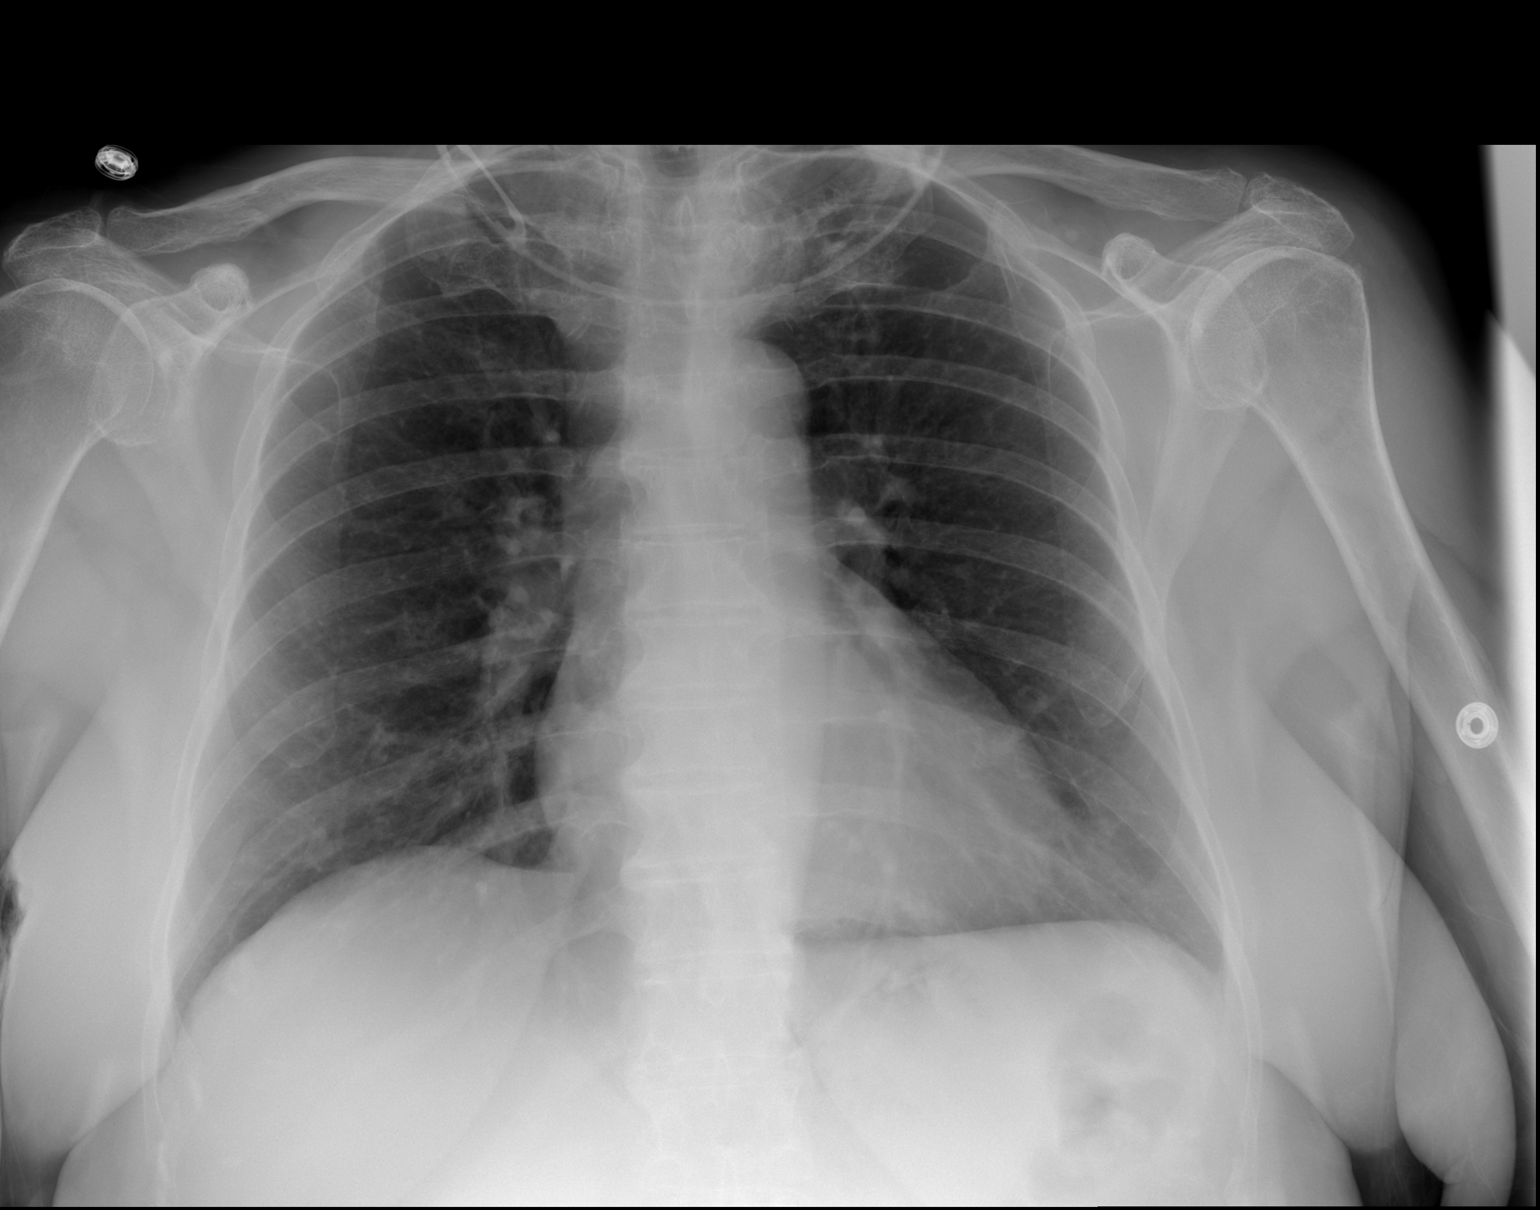

[2 of 2 positions shown; findings below may reference images not displayed]

FINDINGS: Cardiomediastinal silhouette is unremarkable. The lungs are clear
without pleural effusions or focal consolidations. Trachea projects
midline and there is no pneumothorax. Soft tissue planes and
included osseous structures are non-suspicious. Mild degenerative
change of the thoracic spine.
IMPRESSION: No active cardiopulmonary disease.

  By: Chai Tiger

## 2017-01-16 DIAGNOSIS — I7300 Raynaud's syndrome without gangrene: Secondary | ICD-10-CM | POA: Insufficient documentation

## 2018-02-14 DIAGNOSIS — Z23 Encounter for immunization: Secondary | ICD-10-CM | POA: Diagnosis not present

## 2018-06-18 DIAGNOSIS — Z6833 Body mass index (BMI) 33.0-33.9, adult: Secondary | ICD-10-CM | POA: Diagnosis not present

## 2018-06-18 DIAGNOSIS — Z13220 Encounter for screening for lipoid disorders: Secondary | ICD-10-CM | POA: Diagnosis not present

## 2018-06-18 DIAGNOSIS — Z130 Encounter for screening for diseases of the blood and blood-forming organs and certain disorders involving the immune mechanism: Secondary | ICD-10-CM | POA: Diagnosis not present

## 2018-06-18 DIAGNOSIS — Z0189 Encounter for other specified special examinations: Secondary | ICD-10-CM | POA: Diagnosis not present

## 2018-06-18 DIAGNOSIS — I7300 Raynaud's syndrome without gangrene: Secondary | ICD-10-CM | POA: Diagnosis not present

## 2018-06-18 DIAGNOSIS — Z1329 Encounter for screening for other suspected endocrine disorder: Secondary | ICD-10-CM | POA: Diagnosis not present

## 2018-06-18 DIAGNOSIS — W458XXA Other foreign body or object entering through skin, initial encounter: Secondary | ICD-10-CM | POA: Diagnosis not present

## 2018-06-25 DIAGNOSIS — R7301 Impaired fasting glucose: Secondary | ICD-10-CM | POA: Diagnosis not present

## 2018-06-25 DIAGNOSIS — E782 Mixed hyperlipidemia: Secondary | ICD-10-CM | POA: Diagnosis not present

## 2018-06-25 DIAGNOSIS — F39 Unspecified mood [affective] disorder: Secondary | ICD-10-CM | POA: Diagnosis not present

## 2018-07-27 DIAGNOSIS — Z1212 Encounter for screening for malignant neoplasm of rectum: Secondary | ICD-10-CM | POA: Diagnosis not present

## 2018-07-27 DIAGNOSIS — Z1211 Encounter for screening for malignant neoplasm of colon: Secondary | ICD-10-CM | POA: Diagnosis not present

## 2018-11-05 DIAGNOSIS — F331 Major depressive disorder, recurrent, moderate: Secondary | ICD-10-CM | POA: Diagnosis not present

## 2019-01-07 DIAGNOSIS — Z23 Encounter for immunization: Secondary | ICD-10-CM | POA: Diagnosis not present

## 2019-01-12 DIAGNOSIS — Z20828 Contact with and (suspected) exposure to other viral communicable diseases: Secondary | ICD-10-CM | POA: Diagnosis not present

## 2019-10-17 DIAGNOSIS — L439 Lichen planus, unspecified: Secondary | ICD-10-CM | POA: Insufficient documentation

## 2019-12-23 DIAGNOSIS — Z23 Encounter for immunization: Secondary | ICD-10-CM | POA: Diagnosis not present

## 2020-04-20 DIAGNOSIS — R2243 Localized swelling, mass and lump, lower limb, bilateral: Secondary | ICD-10-CM | POA: Diagnosis not present

## 2020-04-20 DIAGNOSIS — M62830 Muscle spasm of back: Secondary | ICD-10-CM | POA: Diagnosis not present

## 2020-04-20 DIAGNOSIS — M62838 Other muscle spasm: Secondary | ICD-10-CM | POA: Diagnosis not present

## 2020-04-20 DIAGNOSIS — R7301 Impaired fasting glucose: Secondary | ICD-10-CM | POA: Diagnosis not present

## 2020-04-20 DIAGNOSIS — F331 Major depressive disorder, recurrent, moderate: Secondary | ICD-10-CM | POA: Diagnosis not present

## 2020-04-20 DIAGNOSIS — Z6832 Body mass index (BMI) 32.0-32.9, adult: Secondary | ICD-10-CM | POA: Diagnosis not present

## 2020-04-20 DIAGNOSIS — R5383 Other fatigue: Secondary | ICD-10-CM | POA: Diagnosis not present

## 2020-04-20 DIAGNOSIS — Z23 Encounter for immunization: Secondary | ICD-10-CM | POA: Diagnosis not present

## 2020-04-20 DIAGNOSIS — W458XXA Other foreign body or object entering through skin, initial encounter: Secondary | ICD-10-CM | POA: Diagnosis not present

## 2020-04-20 DIAGNOSIS — Z0189 Encounter for other specified special examinations: Secondary | ICD-10-CM | POA: Diagnosis not present

## 2020-04-20 DIAGNOSIS — F321 Major depressive disorder, single episode, moderate: Secondary | ICD-10-CM | POA: Diagnosis not present

## 2020-04-20 DIAGNOSIS — E782 Mixed hyperlipidemia: Secondary | ICD-10-CM | POA: Diagnosis not present

## 2020-04-20 DIAGNOSIS — D2339 Other benign neoplasm of skin of other parts of face: Secondary | ICD-10-CM | POA: Diagnosis not present

## 2020-04-20 DIAGNOSIS — E669 Obesity, unspecified: Secondary | ICD-10-CM | POA: Diagnosis not present

## 2020-04-20 DIAGNOSIS — F39 Unspecified mood [affective] disorder: Secondary | ICD-10-CM | POA: Diagnosis not present

## 2020-04-20 DIAGNOSIS — Z6833 Body mass index (BMI) 33.0-33.9, adult: Secondary | ICD-10-CM | POA: Diagnosis not present

## 2020-04-20 DIAGNOSIS — M6281 Muscle weakness (generalized): Secondary | ICD-10-CM | POA: Diagnosis not present

## 2020-04-20 DIAGNOSIS — Z712 Person consulting for explanation of examination or test findings: Secondary | ICD-10-CM | POA: Diagnosis not present

## 2020-04-20 DIAGNOSIS — I7300 Raynaud's syndrome without gangrene: Secondary | ICD-10-CM | POA: Diagnosis not present

## 2020-04-27 DIAGNOSIS — I7300 Raynaud's syndrome without gangrene: Secondary | ICD-10-CM | POA: Diagnosis not present

## 2020-04-27 DIAGNOSIS — E669 Obesity, unspecified: Secondary | ICD-10-CM | POA: Diagnosis not present

## 2020-04-27 DIAGNOSIS — Z6832 Body mass index (BMI) 32.0-32.9, adult: Secondary | ICD-10-CM | POA: Diagnosis not present

## 2020-04-27 DIAGNOSIS — E782 Mixed hyperlipidemia: Secondary | ICD-10-CM | POA: Diagnosis not present

## 2020-04-27 DIAGNOSIS — D2339 Other benign neoplasm of skin of other parts of face: Secondary | ICD-10-CM | POA: Diagnosis not present

## 2020-04-27 DIAGNOSIS — R2243 Localized swelling, mass and lump, lower limb, bilateral: Secondary | ICD-10-CM | POA: Diagnosis not present

## 2020-04-27 DIAGNOSIS — L439 Lichen planus, unspecified: Secondary | ICD-10-CM | POA: Diagnosis not present

## 2020-04-27 DIAGNOSIS — M62830 Muscle spasm of back: Secondary | ICD-10-CM | POA: Diagnosis not present

## 2020-04-27 DIAGNOSIS — R5383 Other fatigue: Secondary | ICD-10-CM | POA: Diagnosis not present

## 2020-04-27 DIAGNOSIS — R7303 Prediabetes: Secondary | ICD-10-CM | POA: Diagnosis not present

## 2020-06-01 DIAGNOSIS — L820 Inflamed seborrheic keratosis: Secondary | ICD-10-CM | POA: Diagnosis not present

## 2020-06-01 DIAGNOSIS — L821 Other seborrheic keratosis: Secondary | ICD-10-CM | POA: Diagnosis not present

## 2020-06-01 DIAGNOSIS — D2339 Other benign neoplasm of skin of other parts of face: Secondary | ICD-10-CM | POA: Diagnosis not present

## 2020-06-14 ENCOUNTER — Other Ambulatory Visit: Payer: Self-pay | Admitting: *Deleted

## 2020-06-14 DIAGNOSIS — R0989 Other specified symptoms and signs involving the circulatory and respiratory systems: Secondary | ICD-10-CM

## 2020-06-19 ENCOUNTER — Other Ambulatory Visit: Payer: Self-pay

## 2020-06-19 ENCOUNTER — Encounter: Payer: 59 | Admitting: Vascular Surgery

## 2020-06-19 ENCOUNTER — Encounter: Payer: Self-pay | Admitting: Vascular Surgery

## 2020-06-19 ENCOUNTER — Ambulatory Visit (INDEPENDENT_AMBULATORY_CARE_PROVIDER_SITE_OTHER): Payer: Medicare Other

## 2020-06-19 ENCOUNTER — Ambulatory Visit (INDEPENDENT_AMBULATORY_CARE_PROVIDER_SITE_OTHER): Payer: Medicare Other | Admitting: Vascular Surgery

## 2020-06-19 VITALS — BP 163/96 | HR 63 | Temp 97.9°F | Resp 16 | Ht 64.0 in | Wt 202.0 lb

## 2020-06-19 DIAGNOSIS — R0989 Other specified symptoms and signs involving the circulatory and respiratory systems: Secondary | ICD-10-CM

## 2020-06-19 DIAGNOSIS — I7300 Raynaud's syndrome without gangrene: Secondary | ICD-10-CM | POA: Diagnosis not present

## 2020-06-19 NOTE — Progress Notes (Signed)
[facility] [place]  Patient name: Natalie Cowan MRN: [id number] DOB: 01-10-1951 Sex: female  REASON FOR CONSULT: Evaluation skin changes lower extremities and occasional hands.  HPI: Natalie Cowan is a 70 y.o. female, who is here today for evaluation.  She has a interesting history.  She reports that over the past 3 years she has had notice of cold sensitivity and actually cold to the touch hands in the wintertime.  She has had 2 occasions where she had blanching in the color of her right fourth finger specifically.  This resolved after her hand was warmed.  Earlier this winter she had an episode where she had light socks and muck boots on and was out working with her horses.  She noted persistent discoloration in her left second toe.  She actually had sloughing of the skin of her left second toe.  This all healed without difficulty and has no further tissue loss.  She does report she feels that she does potentially have some persistent numbness in her left second toe.  This is not caused by emotion but is triggered by cold exposure.  History reviewed. No pertinent past medical history.  Family History  Problem Relation Age of Onset  . Hyperlipidemia Mother   . Hypertension Father     SOCIAL HISTORY: Social History   Socioeconomic History  . Marital status: Married    Spouse name: Not on file  . Number of children: Not on file  . Years of education: Not on file  . Highest education level: Not on file  Occupational History  . Not on file  Tobacco Use  . Smoking status: Former Research scientist (life sciences)  . Smokeless tobacco: Never Used  Substance and Sexual Activity  . Alcohol use: Not on file  . Drug use: Not on file  . Sexual activity: Not on file  Other Topics Concern  . Not on file  Social History Narrative  . Not on file   Social Determinants of Health   Financial Resource Strain: Not on file  Food Insecurity: Not on  file  Transportation Needs: Not on file  Physical Activity: Not on file  Stress: Not on file  Social Connections: Not on file  Intimate Partner Violence: Not on file    No Known Allergies  Current Outpatient Medications  Medication Sig Dispense Refill  . acetaminophen (TYLENOL ARTHRITIS PAIN) 650 MG CR tablet Take 650 mg by mouth every 8 (eight) hours as needed for pain.     Marland Kitchen GARCINIA CAMBOGIA-CHROMIUM PO Take 1 tablet by mouth daily.    Marland Kitchen ibuprofen (ADVIL,MOTRIN) 200 MG tablet Take 200 mg by mouth every 6 (six) hours as needed for moderate pain. 3 tablets for pain as needed    . methocarbamol (ROBAXIN) 500 MG tablet Take 1 tablet (500 mg total) by mouth every 8 (eight) hours as needed for muscle spasms. 30 tablet 0  . naproxen sodium (ANAPROX) 220 MG tablet Take 220 mg by mouth daily as needed (for pain).    Marland Kitchen oxyCODONE-acetaminophen (PERCOCET/ROXICET) 5-325 MG per tablet Take 1 tablet by mouth every 4 (four) hours as needed for severe pain. 15 tablet 0  . oxyCODONE-acetaminophen (ROXICET) 5-325 MG per tablet Take 1 tablet by mouth every 6 (six) hours as needed for moderate pain or severe pain. 30 tablet 0   No current facility-administered medications for this visit.    REVIEW OF SYSTEMS:  [X]  denotes positive finding, [ ]  denotes negative finding Cardiac  Comments:  Chest pain or chest pressure:    Shortness of breath upon exertion:    Short of breath when lying flat:    Irregular heart rhythm:        Vascular    Pain in calf, thigh, or hip brought on by ambulation:    Pain in feet at night that wakes you up from your sleep:     Blood clot in your veins:    Leg swelling:         Pulmonary    Oxygen at home:    Productive cough:     Wheezing:         Neurologic    Sudden weakness in arms or legs:     Sudden numbness in arms or legs:     Sudden onset of difficulty speaking or slurred speech:    Temporary loss of vision in one eye:     Problems with dizziness:          Gastrointestinal    Blood in stool:     Vomited blood:         Genitourinary    Burning when urinating:     Blood in urine:        Psychiatric    Major depression:         Hematologic    Bleeding problems:    Problems with blood clotting too easily:        Skin    Rashes or ulcers:        Constitutional    Fever or chills:      PHYSICAL EXAM: Vitals:   06/19/20 0928  BP: (!) 163/96  Pulse: 63  Resp: 16  Temp: 97.9 F (36.6 C)  TempSrc: Other (Comment)  SpO2: 96%  Weight: 202 lb (91.6 kg)  Height: 5\' 4"  (1.626 m)    GENERAL: The patient is a well-nourished female, in no acute distress. The vital signs are documented above. CARDIOVASCULAR: 2+ radial and 2+ dorsalis pedis pulses bilaterally.  No skin changes on her feet bilaterally PULMONARY: There is good air exchange  MUSCULOSKELETAL: There are no major deformities or cyanosis. NEUROLOGIC: No focal weakness or paresthesias are detected. SKIN: There are no ulcers or rashes noted. PSYCHIATRIC: The patient has a normal affect.  DATA:  Noninvasive studies were reviewed with the patient.  She underwent studies in our office today.  This reveals normal ankle arm index and normal triphasic waveforms in both lower extremities.  She also has normal toe brachial index.  MEDICAL ISSUES: Discussed these findings at length with the patient.  She does appear to have vasospasm as the cause of these issues both in her hand and in her feet.  I discussed the etiology of the vasospasm and that she does not have any fixed arterial blockages.  Explained the treatment would be avoiding cold is much as possible and wearing warmer socks and avoiding cold exposure and water exposure to her feet in the wintertime.  Since this is not persistent I would not recommend any medical treatment.  I did explain that the calcium channel blocker would be the treatment if she has progressive difficulty which would be unlikely   Rosetta Posner, MD  FACS [facility] Tel [phone] Pager [phone]  Note: Portions of this report may have been transcribed using voice recognition software.  Every effort has been made to ensure accuracy; however, inadvertent computerized transcription errors may still be present.

## 2020-12-07 DIAGNOSIS — M542 Cervicalgia: Secondary | ICD-10-CM | POA: Diagnosis not present

## 2020-12-07 DIAGNOSIS — M25511 Pain in right shoulder: Secondary | ICD-10-CM | POA: Diagnosis not present

## 2020-12-07 DIAGNOSIS — M25561 Pain in right knee: Secondary | ICD-10-CM | POA: Diagnosis not present

## 2021-03-08 DIAGNOSIS — E782 Mixed hyperlipidemia: Secondary | ICD-10-CM | POA: Diagnosis not present

## 2021-03-08 DIAGNOSIS — R7301 Impaired fasting glucose: Secondary | ICD-10-CM | POA: Diagnosis not present

## 2021-03-15 DIAGNOSIS — R600 Localized edema: Secondary | ICD-10-CM | POA: Diagnosis not present

## 2021-03-15 DIAGNOSIS — D72829 Elevated white blood cell count, unspecified: Secondary | ICD-10-CM | POA: Diagnosis not present

## 2021-03-15 DIAGNOSIS — M25562 Pain in left knee: Secondary | ICD-10-CM | POA: Diagnosis not present

## 2021-03-15 DIAGNOSIS — Z13820 Encounter for screening for osteoporosis: Secondary | ICD-10-CM | POA: Diagnosis not present

## 2021-03-15 DIAGNOSIS — R0609 Other forms of dyspnea: Secondary | ICD-10-CM | POA: Diagnosis not present

## 2021-03-15 DIAGNOSIS — Z23 Encounter for immunization: Secondary | ICD-10-CM | POA: Diagnosis not present

## 2021-03-15 DIAGNOSIS — E782 Mixed hyperlipidemia: Secondary | ICD-10-CM | POA: Diagnosis not present

## 2021-03-15 DIAGNOSIS — R7301 Impaired fasting glucose: Secondary | ICD-10-CM | POA: Diagnosis not present

## 2021-03-15 DIAGNOSIS — I7300 Raynaud's syndrome without gangrene: Secondary | ICD-10-CM | POA: Diagnosis not present

## 2021-03-15 DIAGNOSIS — M25561 Pain in right knee: Secondary | ICD-10-CM | POA: Diagnosis not present

## 2021-03-15 DIAGNOSIS — I10 Essential (primary) hypertension: Secondary | ICD-10-CM | POA: Diagnosis not present

## 2021-03-15 DIAGNOSIS — E669 Obesity, unspecified: Secondary | ICD-10-CM | POA: Diagnosis not present

## 2021-03-16 ENCOUNTER — Other Ambulatory Visit (HOSPITAL_COMMUNITY): Payer: Self-pay | Admitting: Family Medicine

## 2021-03-16 DIAGNOSIS — Z13820 Encounter for screening for osteoporosis: Secondary | ICD-10-CM

## 2021-07-12 DIAGNOSIS — I10 Essential (primary) hypertension: Secondary | ICD-10-CM | POA: Diagnosis not present

## 2021-07-12 DIAGNOSIS — R7301 Impaired fasting glucose: Secondary | ICD-10-CM | POA: Diagnosis not present

## 2021-07-19 DIAGNOSIS — I7300 Raynaud's syndrome without gangrene: Secondary | ICD-10-CM | POA: Diagnosis not present

## 2021-07-19 DIAGNOSIS — R7301 Impaired fasting glucose: Secondary | ICD-10-CM | POA: Diagnosis not present

## 2021-07-19 DIAGNOSIS — Z13820 Encounter for screening for osteoporosis: Secondary | ICD-10-CM | POA: Diagnosis not present

## 2021-07-19 DIAGNOSIS — M25562 Pain in left knee: Secondary | ICD-10-CM | POA: Diagnosis not present

## 2021-07-19 DIAGNOSIS — M25561 Pain in right knee: Secondary | ICD-10-CM | POA: Diagnosis not present

## 2021-07-19 DIAGNOSIS — E782 Mixed hyperlipidemia: Secondary | ICD-10-CM | POA: Diagnosis not present

## 2021-07-19 DIAGNOSIS — Z1239 Encounter for other screening for malignant neoplasm of breast: Secondary | ICD-10-CM | POA: Diagnosis not present

## 2021-07-19 DIAGNOSIS — D72829 Elevated white blood cell count, unspecified: Secondary | ICD-10-CM | POA: Diagnosis not present

## 2021-07-19 DIAGNOSIS — I10 Essential (primary) hypertension: Secondary | ICD-10-CM | POA: Diagnosis not present

## 2021-07-19 DIAGNOSIS — R0609 Other forms of dyspnea: Secondary | ICD-10-CM | POA: Diagnosis not present

## 2021-07-19 DIAGNOSIS — R600 Localized edema: Secondary | ICD-10-CM | POA: Diagnosis not present

## 2021-07-19 DIAGNOSIS — E669 Obesity, unspecified: Secondary | ICD-10-CM | POA: Diagnosis not present

## 2022-01-17 DIAGNOSIS — Z23 Encounter for immunization: Secondary | ICD-10-CM | POA: Diagnosis not present

## 2022-01-17 DIAGNOSIS — I10 Essential (primary) hypertension: Secondary | ICD-10-CM | POA: Diagnosis not present

## 2022-01-17 DIAGNOSIS — R7301 Impaired fasting glucose: Secondary | ICD-10-CM | POA: Diagnosis not present

## 2022-01-17 DIAGNOSIS — E782 Mixed hyperlipidemia: Secondary | ICD-10-CM | POA: Diagnosis not present

## 2022-01-24 DIAGNOSIS — I10 Essential (primary) hypertension: Secondary | ICD-10-CM | POA: Diagnosis not present

## 2022-01-24 DIAGNOSIS — E669 Obesity, unspecified: Secondary | ICD-10-CM | POA: Diagnosis not present

## 2022-01-24 DIAGNOSIS — E782 Mixed hyperlipidemia: Secondary | ICD-10-CM | POA: Diagnosis not present

## 2022-01-24 DIAGNOSIS — I7300 Raynaud's syndrome without gangrene: Secondary | ICD-10-CM | POA: Diagnosis not present

## 2022-01-24 DIAGNOSIS — M25561 Pain in right knee: Secondary | ICD-10-CM | POA: Diagnosis not present

## 2022-01-24 DIAGNOSIS — Z0000 Encounter for general adult medical examination without abnormal findings: Secondary | ICD-10-CM | POA: Diagnosis not present

## 2022-01-24 DIAGNOSIS — Z13820 Encounter for screening for osteoporosis: Secondary | ICD-10-CM | POA: Diagnosis not present

## 2022-01-24 DIAGNOSIS — R600 Localized edema: Secondary | ICD-10-CM | POA: Diagnosis not present

## 2022-01-24 DIAGNOSIS — D72829 Elevated white blood cell count, unspecified: Secondary | ICD-10-CM | POA: Diagnosis not present

## 2022-01-24 DIAGNOSIS — M25562 Pain in left knee: Secondary | ICD-10-CM | POA: Diagnosis not present

## 2022-01-24 DIAGNOSIS — R7303 Prediabetes: Secondary | ICD-10-CM | POA: Diagnosis not present

## 2022-01-24 DIAGNOSIS — R0609 Other forms of dyspnea: Secondary | ICD-10-CM | POA: Diagnosis not present

## 2022-02-13 DIAGNOSIS — Z1211 Encounter for screening for malignant neoplasm of colon: Secondary | ICD-10-CM | POA: Diagnosis not present

## 2022-02-21 LAB — EXTERNAL GENERIC LAB PROCEDURE: COLOGUARD: NEGATIVE

## 2022-02-21 LAB — COLOGUARD: COLOGUARD: NEGATIVE

## 2022-06-20 ENCOUNTER — Other Ambulatory Visit (HOSPITAL_COMMUNITY): Payer: Self-pay | Admitting: Family Medicine

## 2022-06-20 ENCOUNTER — Other Ambulatory Visit (HOSPITAL_COMMUNITY): Payer: Self-pay | Admitting: Internal Medicine

## 2022-06-20 DIAGNOSIS — Z1231 Encounter for screening mammogram for malignant neoplasm of breast: Secondary | ICD-10-CM

## 2022-06-20 DIAGNOSIS — Z13820 Encounter for screening for osteoporosis: Secondary | ICD-10-CM

## 2022-07-11 ENCOUNTER — Ambulatory Visit (HOSPITAL_COMMUNITY)
Admission: RE | Admit: 2022-07-11 | Discharge: 2022-07-11 | Disposition: A | Discharge status: Home / Self Care | Payer: Medicare Other | Source: Ambulatory Visit | Attending: Internal Medicine | Admitting: Internal Medicine

## 2022-07-11 ENCOUNTER — Ambulatory Visit (HOSPITAL_COMMUNITY)
Admission: RE | Admit: 2022-07-11 | Discharge: 2022-07-11 | Disposition: A | Discharge status: Home / Self Care | Payer: Medicare Other | Source: Ambulatory Visit | Attending: Family Medicine | Admitting: Family Medicine

## 2022-07-11 DIAGNOSIS — M8589 Other specified disorders of bone density and structure, multiple sites: Secondary | ICD-10-CM | POA: Insufficient documentation

## 2022-07-11 DIAGNOSIS — Z13820 Encounter for screening for osteoporosis: Secondary | ICD-10-CM | POA: Diagnosis not present

## 2022-07-11 DIAGNOSIS — Z1231 Encounter for screening mammogram for malignant neoplasm of breast: Secondary | ICD-10-CM | POA: Diagnosis not present

## 2022-07-11 DIAGNOSIS — Z780 Asymptomatic menopausal state: Secondary | ICD-10-CM | POA: Insufficient documentation

## 2022-07-18 DIAGNOSIS — R7301 Impaired fasting glucose: Secondary | ICD-10-CM | POA: Diagnosis not present

## 2022-07-18 DIAGNOSIS — E782 Mixed hyperlipidemia: Secondary | ICD-10-CM | POA: Diagnosis not present

## 2022-07-18 DIAGNOSIS — I10 Essential (primary) hypertension: Secondary | ICD-10-CM | POA: Diagnosis not present

## 2022-07-25 ENCOUNTER — Other Ambulatory Visit (HOSPITAL_COMMUNITY): Payer: Self-pay | Admitting: Family Medicine

## 2022-07-25 DIAGNOSIS — E669 Obesity, unspecified: Secondary | ICD-10-CM | POA: Diagnosis not present

## 2022-07-25 DIAGNOSIS — M25562 Pain in left knee: Secondary | ICD-10-CM | POA: Diagnosis not present

## 2022-07-25 DIAGNOSIS — D72829 Elevated white blood cell count, unspecified: Secondary | ICD-10-CM | POA: Diagnosis not present

## 2022-07-25 DIAGNOSIS — E875 Hyperkalemia: Secondary | ICD-10-CM | POA: Diagnosis not present

## 2022-07-25 DIAGNOSIS — R7303 Prediabetes: Secondary | ICD-10-CM | POA: Diagnosis not present

## 2022-07-25 DIAGNOSIS — R0609 Other forms of dyspnea: Secondary | ICD-10-CM | POA: Diagnosis not present

## 2022-07-25 DIAGNOSIS — R600 Localized edema: Secondary | ICD-10-CM | POA: Diagnosis not present

## 2022-07-25 DIAGNOSIS — E782 Mixed hyperlipidemia: Secondary | ICD-10-CM

## 2022-07-25 DIAGNOSIS — Z13820 Encounter for screening for osteoporosis: Secondary | ICD-10-CM | POA: Diagnosis not present

## 2022-07-25 DIAGNOSIS — I7300 Raynaud's syndrome without gangrene: Secondary | ICD-10-CM | POA: Diagnosis not present

## 2022-07-25 DIAGNOSIS — I10 Essential (primary) hypertension: Secondary | ICD-10-CM | POA: Diagnosis not present

## 2022-07-25 DIAGNOSIS — M25561 Pain in right knee: Secondary | ICD-10-CM | POA: Diagnosis not present

## 2022-09-26 ENCOUNTER — Ambulatory Visit (HOSPITAL_COMMUNITY)
Admission: RE | Admit: 2022-09-26 | Discharge: 2022-09-26 | Disposition: A | Discharge status: Home / Self Care | Payer: Self-pay | Source: Ambulatory Visit | Attending: Family Medicine | Admitting: Family Medicine

## 2022-09-26 DIAGNOSIS — E782 Mixed hyperlipidemia: Secondary | ICD-10-CM | POA: Insufficient documentation

## 2022-09-26 MED ORDER — IOHEXOL 300 MG/ML  SOLN: 100.0000 mL | Freq: Once | INTRAMUSCULAR | Status: DC | PRN

## 2023-01-23 DIAGNOSIS — E782 Mixed hyperlipidemia: Secondary | ICD-10-CM | POA: Diagnosis not present

## 2023-01-23 DIAGNOSIS — R7301 Impaired fasting glucose: Secondary | ICD-10-CM | POA: Diagnosis not present

## 2023-01-30 ENCOUNTER — Other Ambulatory Visit (HOSPITAL_COMMUNITY): Payer: Self-pay | Admitting: Family Medicine

## 2023-01-30 DIAGNOSIS — R748 Abnormal levels of other serum enzymes: Secondary | ICD-10-CM | POA: Diagnosis not present

## 2023-01-30 DIAGNOSIS — R7303 Prediabetes: Secondary | ICD-10-CM | POA: Diagnosis not present

## 2023-01-30 DIAGNOSIS — I7300 Raynaud's syndrome without gangrene: Secondary | ICD-10-CM | POA: Diagnosis not present

## 2023-01-30 DIAGNOSIS — E669 Obesity, unspecified: Secondary | ICD-10-CM | POA: Diagnosis not present

## 2023-01-30 DIAGNOSIS — R1010 Upper abdominal pain, unspecified: Secondary | ICD-10-CM | POA: Diagnosis not present

## 2023-01-30 DIAGNOSIS — R0609 Other forms of dyspnea: Secondary | ICD-10-CM | POA: Diagnosis not present

## 2023-01-30 DIAGNOSIS — E782 Mixed hyperlipidemia: Secondary | ICD-10-CM | POA: Diagnosis not present

## 2023-01-30 DIAGNOSIS — R600 Localized edema: Secondary | ICD-10-CM | POA: Diagnosis not present

## 2023-01-30 DIAGNOSIS — M25562 Pain in left knee: Secondary | ICD-10-CM | POA: Diagnosis not present

## 2023-01-30 DIAGNOSIS — D72829 Elevated white blood cell count, unspecified: Secondary | ICD-10-CM | POA: Diagnosis not present

## 2023-01-30 DIAGNOSIS — I10 Essential (primary) hypertension: Secondary | ICD-10-CM | POA: Diagnosis not present

## 2023-01-30 DIAGNOSIS — Z23 Encounter for immunization: Secondary | ICD-10-CM | POA: Diagnosis not present

## 2023-01-30 DIAGNOSIS — R252 Cramp and spasm: Secondary | ICD-10-CM | POA: Diagnosis not present

## 2023-01-30 DIAGNOSIS — M25561 Pain in right knee: Secondary | ICD-10-CM | POA: Diagnosis not present

## 2023-02-05 ENCOUNTER — Ambulatory Visit (HOSPITAL_COMMUNITY)
Admission: RE | Admit: 2023-02-05 | Discharge: 2023-02-05 | Disposition: A | Discharge status: Home / Self Care | Payer: Medicare Other | Source: Ambulatory Visit | Attending: Family Medicine | Admitting: Family Medicine

## 2023-02-05 DIAGNOSIS — R1010 Upper abdominal pain, unspecified: Secondary | ICD-10-CM | POA: Insufficient documentation

## 2023-02-07 ENCOUNTER — Other Ambulatory Visit (HOSPITAL_COMMUNITY): Payer: Self-pay | Admitting: Family Medicine

## 2023-02-07 DIAGNOSIS — K8020 Calculus of gallbladder without cholecystitis without obstruction: Secondary | ICD-10-CM

## 2023-02-25 DIAGNOSIS — N952 Postmenopausal atrophic vaginitis: Secondary | ICD-10-CM | POA: Insufficient documentation

## 2023-02-27 ENCOUNTER — Ambulatory Visit: Payer: Medicare Other | Admitting: Orthopedic Surgery

## 2023-02-27 ENCOUNTER — Other Ambulatory Visit (INDEPENDENT_AMBULATORY_CARE_PROVIDER_SITE_OTHER): Payer: Medicare Other

## 2023-02-27 ENCOUNTER — Encounter: Payer: Self-pay | Admitting: Orthopedic Surgery

## 2023-02-27 ENCOUNTER — Other Ambulatory Visit: Payer: Self-pay

## 2023-02-27 VITALS — BP 151/87 | HR 83 | Ht 63.0 in | Wt 210.0 lb

## 2023-02-27 DIAGNOSIS — G8929 Other chronic pain: Secondary | ICD-10-CM

## 2023-02-27 DIAGNOSIS — M25571 Pain in right ankle and joints of right foot: Secondary | ICD-10-CM

## 2023-02-27 DIAGNOSIS — R945 Abnormal results of liver function studies: Secondary | ICD-10-CM | POA: Diagnosis not present

## 2023-02-27 DIAGNOSIS — M170 Bilateral primary osteoarthritis of knee: Secondary | ICD-10-CM | POA: Diagnosis not present

## 2023-02-27 NOTE — Patient Instructions (Signed)
We are referring you to [facility] from [facility] address is [place] The phone number is [phone]  The office will call you with an appointment Dr. Sharol Given

## 2023-02-27 NOTE — Progress Notes (Signed)
Office Visit Note   Patient: Natalie Cowan           Date of Birth: January 21, 1951           MRN: [id number] Visit Date: 02/27/2023 Requested by: Lupita Raider, NP 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  Currently she is having a workup for gallstones and liver function test elevation which will need to be resolved prior to surgery  Encounter Diagnoses  Name Primary?   Bilateral chronic knee pain Yes   Pain in right ankle and joints of right foot     No orders of the defined types were placed in this encounter.    Subjective: Chief Complaint  Patient presents with   Knee Pain    Bilateral     HPI: 72 year old female who was on meloxicam doing well with switch to diclofenac and started having some issues with her LFTs the diclofenac was stopped.  She was placed on the medication for presumed arthritis.  However she has not had any imaging studies done.  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 Bilateral chronic knee pain   2. Pain in right ankle and joints of right foot      Follow-Up Instructions: Return for PREOP.    Objective: Vital Signs: BP (!) 151/87   Pulse 83   Ht 5\' 3"  (1.6 m)   Wt 210 lb (95.3 kg)   BMI 37.20 kg/m   Physical Exam Vitals and nursing note reviewed.  Constitutional:      Appearance: Normal appearance.  HENT:     Head: Normocephalic and atraumatic.  Eyes:     General: No scleral icterus.       Right eye: No discharge.        Left eye: No discharge.     Extraocular Movements:  Extraocular movements intact.     Conjunctiva/sclera: Conjunctivae normal.     Pupils: Pupils are equal, round, and reactive to light.  Cardiovascular:     Rate and Rhythm: Normal rate.     Pulses: Normal pulses.  Musculoskeletal:     Right knee: Effusion present.     Instability Tests: Medial McMurray test negative and lateral McMurray test negative.     Left knee: Effusion present.     Instability Tests: Medial McMurray test negative and lateral McMurray test negative.  Skin:    General: Skin is warm and dry.     Capillary Refill: Capillary refill takes less than 2 seconds.  Neurological:     General: No focal deficit present.     Mental Status: She is alert and oriented to person, place, and time.     Gait: Gait normal.  Psychiatric:        Mood and Affect: Mood normal.        Behavior: Behavior normal.        Thought Content: Thought content normal.        Judgment: Judgment normal.  Right Knee Exam   Muscle Strength  The patient has normal right knee strength.  Tenderness  The patient is experiencing tenderness in the medial joint line.  Range of Motion  Extension:  normal  Flexion:  normal   Tests  McMurray:  Medial - negative Lateral - negative Varus: negative Valgus: negative Drawer:  Anterior - negative    Posterior - negative  Other  Erythema: absent Scars: absent Sensation: normal Pulse: present Swelling: none Effusion: effusion present   Left Knee Exam   Muscle Strength  The patient has normal left knee strength.  Tenderness  The patient is experiencing tenderness in the medial joint line.  Range of Motion  Extension:  normal  Flexion:  normal   Tests  McMurray:  Medial - negative Lateral - negative Varus: negative Valgus: negative Drawer:  Anterior - negative     Posterior - negative  Other  Erythema: absent Scars: absent Sensation: normal Pulse: present Swelling: none Effusion: effusion present     Specialty Comments:   No specialty comments available.  Imaging: DG Knee AP/LAT W/Sunrise Left Result Date: 02/27/2023 Left knee see right knee report Pain left knee Grade 4 arthritis left knee Findings include joint space narrowing medially with bone-on-bone contact secondary bone changes including sclerosis cyst formation osteophyte formation all 3 compartments involved varus alignment noted.   DG Knee AP/LAT W/Sunrise Right Result Date: 02/27/2023 Right and left knee images The right knee is in varus and it has grade 4 degenerative changes in the medial compartment with sclerosis cyst formation and small osteophyte. Similar findings are noted on the left Patellofemoral joints again degenerative changes narrowing of the medial patellofemoral joint with osteophyte formation in the right knee And then medial lateral osteophytes patellofemoral joint left knee Lateral imaging left and right knee show the extensive medial compartment degenerative changes with bone-on-bone contact on the left knee and less than half millimeter of space on the right knee Severe arthritis grade 4 both knees all 3 compartments with varus alignment   DG Ankle Complete Right Result Date: 02/27/2023 Right ankle.  Right ankle falling inwards swelling medially tenderness medially The ankle joint is maintained in terms of the ankle mortise. There is a small anterior osteophyte/fragmentation at the anterior margin of the ankle joint there is a prominent trigonal Achilles area looks normal Impression normal ankle joint     PMFS History: Patient Active Problem List   Diagnosis Date Noted   Atrophic vaginitis 02/25/2023   Lichen planus 10/17/2019   Raynaud disease 01/16/2017   History reviewed. No pertinent past medical history.  Family History  Problem Relation Age of Onset   Hyperlipidemia Mother    Hypertension Father     Past Surgical History:  Procedure Laterality Date   ABDOMINAL HYSTERECTOMY     CESAREAN SECTION      TONSILLECTOMY     TUBAL LIGATION     Social History   Occupational History   Not on file  Tobacco Use   Smoking status: Former   Smokeless tobacco: Never  Substance and Sexual Activity   Alcohol use: Not on file   Drug use: Not on file   Sexual activity: Not on file

## 2023-03-20 ENCOUNTER — Ambulatory Visit (HOSPITAL_COMMUNITY)
Admission: RE | Admit: 2023-03-20 | Discharge: 2023-03-20 | Disposition: A | Discharge status: Home / Self Care | Payer: Medicare Other | Source: Ambulatory Visit | Attending: Family Medicine | Admitting: Family Medicine

## 2023-03-20 ENCOUNTER — Encounter: Payer: Self-pay | Admitting: Orthopedic Surgery

## 2023-03-20 ENCOUNTER — Ambulatory Visit: Payer: Medicare Other | Admitting: Orthopedic Surgery

## 2023-03-20 DIAGNOSIS — G5761 Lesion of plantar nerve, right lower limb: Secondary | ICD-10-CM | POA: Diagnosis not present

## 2023-03-20 DIAGNOSIS — M12571 Traumatic arthropathy, right ankle and foot: Secondary | ICD-10-CM

## 2023-03-20 DIAGNOSIS — K8020 Calculus of gallbladder without cholecystitis without obstruction: Secondary | ICD-10-CM | POA: Insufficient documentation

## 2023-03-20 DIAGNOSIS — N200 Calculus of kidney: Secondary | ICD-10-CM | POA: Diagnosis not present

## 2023-03-20 MED ORDER — IOHEXOL 350 MG/ML SOLN
80.0000 mL | Freq: Once | INTRAVENOUS | Status: AC | PRN
  Administered 2023-03-20: 80 mL via INTRAVENOUS

## 2023-03-20 MED ORDER — IOHEXOL 350 MG/ML SOLN: 80.0000 mL | Freq: Once | INTRAVENOUS | Status: DC | PRN

## 2023-03-20 MED ORDER — IOHEXOL 300 MG/ML  SOLN: 100.0000 mL | Freq: Once | INTRAMUSCULAR | Status: DC | PRN

## 2023-03-20 NOTE — Progress Notes (Signed)
 Office Visit Note   Patient: Natalie Cowan           Date of Birth: 30-Jun-1950           MRN: [id number] Visit Date: 03/20/2023              Requested by: Margrette Taft BRAVO, MD [address] [place],  [place] [address] PCP: Shona Norleen PEDLAR, MD  Chief Complaint  Patient presents with   Right Ankle - Pain      HPI: Patient is a 73 year old woman who is seen in referral from Dr. Margrette for right ankle and right forefoot pain.  Patient has a history of multiple ankle sprains and history of extensive horseback riding.  Patient also has osteoarthritis in both knees.  Assessment & Plan: Visit Diagnoses:  1. Morton neuroma of right foot   2. Traumatic arthritis of right ankle     Plan: Recommended sole orthotics for her custom plastic orthotic for arch support.  Recommended a wide sneaker to unload the forefoot.  Discussed that if the ankle symptoms worsen we could proceed with a intra-articular injection and if the neuroma symptoms worsen we could proceed with a injection as well.  Follow-Up Instructions: Return if symptoms worsen or fail to improve.   Ortho Exam  Patient is alert, oriented, no adenopathy, well-dressed, normal affect, normal respiratory effort. Examination patient has a good pulse she has pain to palpation beneath the metatarsal heads lateral compression is not tender but she does have some tenderness to palpation over the third webspace.  With patient standing she has a valgus hindfoot with laxity of the deltoid.  She has good strength with the posterior tibial tendon.  Review of the radiographs shows a 9 congruent ankle joint with calcifications secondary to deltoid injury.  Discussed she could follow-up with Dr. Margrette if she is interested in a total knee arthroplasty.  Imaging: No results found. No images are attached to the encounter.  Labs: No results found for: HGBA1C, ESRSEDRATE, CRP, LABURIC, REPTSTATUS, GRAMSTAIN, CULT,  LABORGA   No results found for: ALBUMIN, PREALBUMIN, CBC  No results found for: MG No results found for: VD25OH  No results found for: PREALBUMIN    Latest Ref Rng & Units 11/07/2013    9:45 PM 09/30/2013   11:45 PM  CBC EXTENDED  WBC 4.0 - 10.5 K/uL 9.7  15.0   RBC 3.87 - 5.11 MIL/uL 4.47  4.49   Hemoglobin 12.0 - 15.0 g/dL 87.1  87.1   HCT 63.9 - 46.0 % 38.7  39.3   Platelets 150 - 400 K/uL 245  227   NEUT# 1.7 - 7.7 K/uL  13.0   Lymph# 0.7 - 4.0 K/uL  1.0      There is no height or weight on file to calculate BMI.  Orders:  No orders of the defined types were placed in this encounter.  No orders of the defined types were placed in this encounter.    Procedures: No procedures performed  Clinical Data: No additional findings.  ROS:  All other systems negative, except as noted in the HPI. Review of Systems  Objective: Vital Signs: There were no vitals taken for this visit.  Specialty Comments:  No specialty comments available.  PMFS History: Patient Active Problem List   Diagnosis Date Noted   Atrophic vaginitis 02/25/2023   Lichen planus 10/17/2019   Raynaud disease 01/16/2017   History reviewed. No pertinent past medical history.  Family History  Problem Relation Age of Onset   Hyperlipidemia Mother    Hypertension Father     Past Surgical History:  Procedure Laterality Date   ABDOMINAL HYSTERECTOMY     CESAREAN SECTION     TONSILLECTOMY     TUBAL LIGATION     Social History   Occupational History   Not on file  Tobacco Use   Smoking status: Former   Smokeless tobacco: Never  Substance and Sexual Activity   Alcohol use: Not on file   Drug use: Not on file   Sexual activity: Not on file

## 2023-03-27 ENCOUNTER — Encounter: Payer: Medicare Other | Admitting: Orthopedic Surgery

## 2023-04-25 NOTE — Therapy (Signed)
OUTPATIENT PHYSICAL THERAPY LOWER EXTREMITY EVALUATION   Patient Name: Natalie Cowan MRN: [id number] DOB:1950-08-10, 73 y.o., female Today's Date: 05/01/2023  END OF SESSION:  PT End of Session - 05/01/23 0853     Visit Number 1    Number of Visits 6    Date for PT Re-Evaluation 06/12/23    Authorization Type Medicare    Progress Note Due on Visit 10    PT Start Time 0850    PT Stop Time 0930    PT Time Calculation (min) 40 min    Activity Tolerance Patient tolerated treatment well    Behavior During Therapy [facility] for tasks assessed/performed             No past medical history on file. Past Surgical History:  Procedure Laterality Date   ABDOMINAL HYSTERECTOMY     CESAREAN SECTION     TONSILLECTOMY     TUBAL LIGATION     Patient Active Problem List   Diagnosis Date Noted   Atrophic vaginitis 02/25/2023   Lichen planus 10/17/2019   Raynaud disease 01/16/2017    PCP: Benita Stabile, MD  REFERRING PROVIDER: Lupita Raider, NP  REFERRING DIAG:  Free Text Diagnosis  PAIN IN RIGHT KNEE PAIN IN LEFT KNEE    THERAPY DIAG:  Chronic pain of both knees - Plan: PT plan of care cert/re-cert  Difficulty in walking, not elsewhere classified - Plan: PT plan of care cert/re-cert  Rationale for Evaluation and Treatment: Rehabilitation  ONSET DATE: chronic   SUBJECTIVE:   SUBJECTIVE STATEMENT: Chronic knee pain bilaterally; severe arthritis in both knees; per Dr. Romeo Apple she needs both knees replaced.  She wants to wait until she retires to have her knees replaced.  She is a Administrator, [employer] and is on her feet quite a bit.  Knees buckle sometimes; has difficulty with managing curbs; has multiple horses and dogs of her own she cares for her; has not been able to ride her horses and has gained weight. Cannot get down or up out of the floor  PERTINENT HISTORY: Very busy;  Sprained right ankle multiple times PAIN:  Are you having pain? Yes: NPRS scale: 0-9 Pain location: both  knees Pain description: aching Aggravating factors: getting up from sitting Relieving factors: NSAIDS  PRECAUTIONS: None     WEIGHT BEARING RESTRICTIONS: No  FALLS:  Has patient fallen in last 6 months? Yes. Number of falls 1  OCCUPATION: vet  PLOF: Independent  PATIENT GOALS: come up with a plan to manage knee pain and instability until   NEXT MD VISIT: PRN  OBJECTIVE:  Note: Objective measures were completed at Evaluation unless otherwise noted.  DIAGNOSTIC FINDINGS:  Pain left knee   Grade 4 arthritis left knee   Findings include joint space narrowing medially with bone-on-bone contact secondary bone changes including sclerosis cyst formation osteophyte formation all 3 compartments involved varus alignment noted.   Right and left knee images   The right knee is in varus and it has grade 4 degenerative changes in the medial compartment with sclerosis cyst formation and small osteophyte.   Similar findings are noted on the left   Patellofemoral joints again degenerative changes narrowing of the medial patellofemoral joint with osteophyte formation in the right knee   And then medial lateral osteophytes patellofemoral joint left knee   Lateral imaging left and right knee show the extensive medial compartment degenerative changes with bone-on-bone contact on the left knee and less than half millimeter of space  on the right knee   Severe arthritis grade 4 both knees all 3 compartments with varus alignment     PATIENT SURVEYS:  LEFS 9.74 using hands to assist up  COGNITION: Overall cognitive status: Within functional limits for tasks assessed     Denies N/T  Some swelling noted  PALPATION: Minimal tenderness; noted decreased patella mobility bilaterally  LOWER EXTREMITY ROM:  Active ROM Right eval Left eval  Hip flexion    Hip extension    Hip abduction    Hip adduction    Hip internal rotation    Hip external rotation    Knee flexion 121 110   Knee extension 0 -4  Ankle dorsiflexion    Ankle plantarflexion    Ankle inversion    Ankle eversion     (Blank rows = not tested)  LOWER EXTREMITY MMT:  MMT Right eval Left eval  Hip flexion 4 4+  Hip extension    Hip abduction    Hip adduction    Hip internal rotation    Hip external rotation    Knee flexion(tested in midrange sitting) 4+ 4+  Knee extension 4- 4+  Ankle dorsiflexion 5 5  Ankle plantarflexion    Ankle inversion    Ankle eversion     (Blank rows = not tested)  FUNCTIONAL TESTS:  5 times sit to stand: 9.89 sec using hands to assist  GAIT: Distance walked: 50 ft in clinic Assistive device utilized: None Level of assistance: Modified independence Comments: antalgic gait; furniture walks                                                                                                                                TREATMENT DATE: 05/01/23 physical therapy evaluation and HEP instruction; trial of kinesiotape to right knee for support   PATIENT EDUCATION:  Education details: Patient educated on exam findings, POC, scope of PT, HEP, and trial of kinesiotape. Person educated: Patient Education method: Explanation, Demonstration, and Handouts Education comprehension: verbalized understanding, returned demonstration, verbal cues required, and tactile cues required  HOME EXERCISE PROGRAM: Access Code: [email or web address] URL: [email or web address] Date: 05/01/2023 Prepared by: AP - Rehab  Exercises - Supine Quad Set  - 2 x daily - 7 x weekly - 1 sets - 10 reps - Active Straight Leg Raise with Quad Set  - 2 x daily - 7 x weekly - 1 sets - 10 reps - gentle 4 way patellar glides  - 2 x daily - 7 x weekly - 1 sets - 10 reps  ASSESSMENT:  CLINICAL IMPRESSION: Patient is a 73 y.o. female who was seen today for physical therapy evaluation and treatment for bilateral knee pain.Patient demonstrates muscle weakness, reduced ROM, and fascial restrictions which  are likely contributing to symptoms of pain and are negatively impacting patient ability to perform ADLs and functional mobility tasks. Patient will benefit from skilled physical therapy services to address these deficits to reduce pain  and improve level of function with ADLs and functional mobility tasks.   OBJECTIVE IMPAIRMENTS: Abnormal gait, decreased activity tolerance, decreased balance, decreased mobility, decreased ROM, decreased strength, increased fascial restrictions, impaired perceived functional ability, and pain.   ACTIVITY LIMITATIONS: carrying, lifting, bending, sitting, standing, squatting, sleeping, stairs, transfers, bathing, toileting, locomotion level, and caring for others  PARTICIPATION LIMITATIONS: meal prep, cleaning, laundry, shopping, community activity, occupation, and yard work  PERSONAL FACTORS: Profession are also affecting patient's functional outcome.   REHAB POTENTIAL: Good  CLINICAL DECISION MAKING: Evolving/moderate complexity  EVALUATION COMPLEXITY: Moderate   GOALS: Goals reviewed with patient? No  SHORT TERM GOALS: Target date: 05/22/2023 patient will be independent with initial HEP  Baseline: Goal status: INITIAL  2.  Patient will report 30% improvement overall  Baseline:  Goal status: INITIAL   LONG TERM GOALS: Target date: 06/12/2023  Patient will be independent in self management strategies to improve quality of life and functional outcomes.   Baseline:  Goal status: INITIAL  2.  Patient will report 50% improvement overall  Baseline:  Goal status: INITIAL  3.  Patient will improve LEFS score by 12  points or more to demonstrate improved perceived function  Baseline: 28/80 Goal status: INITIAL  4.  Patient will ambulate x 225 ft in 2 minutes with minimal gait deviation Baseline:  Goal status: INITIAL  5.  Patient will increase right leg MMT's to 5/5 to allow navigation of steps without gait deviation or loss of  balance  Baseline: see above Goal status: INITIAL   PLAN:  PT FREQUENCY: 1x/week  PT DURATION: 6 weeks  PLANNED INTERVENTIONS: 97164- PT Re-evaluation, 97110-Therapeutic exercises, 97530- Therapeutic activity, 97112- Neuromuscular re-education, 97535- Self Care, 16109- Manual therapy, [phone]- Gait training, [phone]- Orthotic Fit/training, [phone]- Canalith repositioning, [id number]- Aquatic Therapy, [phone]- Splinting, Patient/Family education, Balance training, Stair training, Taping, Dry Needling, Joint mobilization, Joint manipulation, Spinal manipulation, Spinal mobilization, Scar mobilization, and DME instructions.   PLAN FOR NEXT SESSION: Review HEP and goals; did kinesiotape help? Test hip MMT's and 2 MWT; knee mobility and strength   10:16 AM, 05/01/23 Madden Piazza Small Talyia Allende MPT [place] [facility] [place] [phone] [phone]

## 2023-05-01 ENCOUNTER — Ambulatory Visit (HOSPITAL_COMMUNITY): Payer: Medicare Other | Attending: Family Medicine

## 2023-05-01 ENCOUNTER — Other Ambulatory Visit: Payer: Self-pay

## 2023-05-01 DIAGNOSIS — M25561 Pain in right knee: Secondary | ICD-10-CM | POA: Insufficient documentation

## 2023-05-01 DIAGNOSIS — G8929 Other chronic pain: Secondary | ICD-10-CM | POA: Diagnosis not present

## 2023-05-01 DIAGNOSIS — R262 Difficulty in walking, not elsewhere classified: Secondary | ICD-10-CM | POA: Insufficient documentation

## 2023-05-01 DIAGNOSIS — M25562 Pain in left knee: Secondary | ICD-10-CM | POA: Insufficient documentation

## 2023-05-08 ENCOUNTER — Encounter (HOSPITAL_COMMUNITY): Payer: Self-pay | Admitting: Physical Therapy

## 2023-05-08 ENCOUNTER — Ambulatory Visit (HOSPITAL_COMMUNITY): Payer: Medicare Other | Admitting: Physical Therapy

## 2023-05-08 DIAGNOSIS — G8929 Other chronic pain: Secondary | ICD-10-CM | POA: Diagnosis not present

## 2023-05-08 DIAGNOSIS — R262 Difficulty in walking, not elsewhere classified: Secondary | ICD-10-CM

## 2023-05-08 DIAGNOSIS — M25561 Pain in right knee: Secondary | ICD-10-CM | POA: Diagnosis not present

## 2023-05-08 DIAGNOSIS — M25562 Pain in left knee: Secondary | ICD-10-CM | POA: Diagnosis not present

## 2023-05-08 NOTE — Therapy (Signed)
 OUTPATIENT PHYSICAL THERAPY TREATMENT   Patient Name: Natalie Cowan MRN: [id number] DOB:02-Jun-1950, 73 y.o., female Today's Date: 05/08/2023  END OF SESSION:  PT End of Session - 05/08/23 1319     Visit Number 2    Number of Visits 6    Date for PT Re-Evaluation 06/12/23    Authorization Type Medicare    Progress Note Due on Visit 10    PT Start Time 1105    PT Stop Time 1155    PT Time Calculation (min) 50 min    Activity Tolerance Patient tolerated treatment well    Behavior During Therapy [facility] for tasks assessed/performed              No past medical history on file. Past Surgical History:  Procedure Laterality Date   CESAREAN SECTION     Patient Active Problem List   Diagnosis Date Noted   Atrophic vaginitis 02/25/2023   Lichen planus 10/17/2019   Raynaud disease 01/16/2017    PCP: Benita Stabile, MD  REFERRING PROVIDER: Lupita Raider, NP  REFERRING DIAG:  Free Text Diagnosis  PAIN IN RIGHT KNEE PAIN IN LEFT KNEE    THERAPY DIAG:  Chronic pain of both knees  Difficulty in walking, not elsewhere classified  Rationale for Evaluation and Treatment: Rehabilitation  ONSET DATE: chronic   SUBJECTIVE:   SUBJECTIVE STATEMENT: Pt reports the tape started coming off and then developed a sharp pain inferior-lateral knee.  States she did not notice any real benefit. States she does feel the exercises are already helping.   Evaluation: Chronic knee pain bilaterally; severe arthritis in both knees; per Dr. Romeo Apple she needs both knees replaced.  She wants to wait until she retires to have her knees replaced.  She is a Administrator, [employer] and is on her feet quite a bit.  Knees buckle sometimes; has difficulty with managing curbs; has multiple horses and dogs of her own she cares for her; has not been able to ride her horses and has gained weight. Cannot get down or up out of the floor  PERTINENT HISTORY: Very busy;  Sprained right ankle multiple times PAIN:  Are you  having pain? Yes: NPRS scale: 0-9 Pain location: both knees Pain description: aching Aggravating factors: getting up from sitting Relieving factors: NSAIDS  PRECAUTIONS: None     WEIGHT BEARING RESTRICTIONS: No  FALLS:  Has patient fallen in last 6 months? Yes. Number of falls 1  OCCUPATION: vet  PLOF: Independent  PATIENT GOALS: come up with a plan to manage knee pain and instability until   NEXT MD VISIT: PRN  OBJECTIVE:  Note: Objective measures were completed at Evaluation unless otherwise noted.  DIAGNOSTIC FINDINGS:  Pain left knee   Grade 4 arthritis left knee   Findings include joint space narrowing medially with bone-on-bone contact secondary bone changes including sclerosis cyst formation osteophyte formation all 3 compartments involved varus alignment noted.   Right and left knee images   The right knee is in varus and it has grade 4 degenerative changes in the medial compartment with sclerosis cyst formation and small osteophyte.   Similar findings are noted on the left   Patellofemoral joints again degenerative changes narrowing of the medial patellofemoral joint with osteophyte formation in the right knee   And then medial lateral osteophytes patellofemoral joint left knee   Lateral imaging left and right knee show the extensive medial compartment degenerative changes with bone-on-bone contact on the left knee and less than  half millimeter of space on the right knee   Severe arthritis grade 4 both knees all 3 compartments with varus alignment     PATIENT SURVEYS:  LEFS 9.74 using hands to assist up  COGNITION: Overall cognitive status: Within functional limits for tasks assessed     Denies N/T  Some swelling noted  PALPATION: Minimal tenderness; noted decreased patella mobility bilaterally  LOWER EXTREMITY ROM:  Active ROM Right eval Left eval  Hip flexion    Hip extension    Hip abduction    Hip adduction    Hip internal rotation     Hip external rotation    Knee flexion 121 110  Knee extension 0 -4  Ankle dorsiflexion    Ankle plantarflexion    Ankle inversion    Ankle eversion     (Blank rows = not tested)  LOWER EXTREMITY MMT:  MMT Right eval Left eval  Hip flexion 4 4+  Hip extension 3+*  4-  Hip abduction 3+* 4-  Hip adduction 4 4  Hip internal rotation    Hip external rotation    Knee flexion(tested in midrange sitting) 4+ 4+  Knee extension 4- 4+  Ankle dorsiflexion 5 5  Ankle plantarflexion    Ankle inversion    Ankle eversion     (Blank rows = not tested) * with pain in groin with MMT  FUNCTIONAL TESTS:  5 times sit to stand: 9.89 sec using hands to assist  GAIT: Distance walked: 50 ft in clinic Assistive device utilized: None Level of assistance: Modified independence Comments: antalgic gait; furniture walks                                                                                                                                TREATMENT DATE: 05/08/23 Bil LE quadsets 10X5" Bil LE SLR 10X each Bridge 10X Hamstring stretch 2X30" with strap Piriformis stretch supine 2X30" each Standing gastroc stretch on step 2X30" each Goal review, POC moving forward  05/01/23 physical therapy evaluation and HEP instruction; trial of kinesiotape to right knee for support   PATIENT EDUCATION:  Education details: Patient educated on exam findings, POC, scope of PT, HEP, and trial of kinesiotape. Person educated: Patient Education method: Explanation, Demonstration, and Handouts Education comprehension: verbalized understanding, returned demonstration, verbal cues required, and tactile cues required  HOME EXERCISE PROGRAM: Access Code: [email or web address] URL: [email or web address] Date: 05/01/2023 Prepared by: AP - Rehab  Exercises - Supine Quad Set  - 2 x daily - 7 x weekly - 1 sets - 10 reps - Active Straight Leg Raise with Quad Set  - 2 x daily - 7 x weekly - 1 sets - 10  reps - gentle 4 way patellar glides  - 2 x daily - 7 x weekly - 1 sets - 10 reps Access Code: [email or web address] URL: [email or web address] Date: 05/08/2023 Prepared by: Emeline Gins  Exercises - Supine Quad Set  - 2 x daily -  7 x weekly - 1 sets - 10 reps - Active Straight Leg Raise with Quad Set  - 2 x daily - 7 x weekly - 1 sets - 10 reps - gentle 4 way patellar glides  - 2 x daily - 7 x weekly - 1 sets - 10 reps - Supine Bridge  - 2 x daily - 7 x weekly - 10 reps - Hooklying Hamstring Stretch with Strap  - 2 x daily - 7 x weekly - 3 reps - 30 sec hold - Supine Piriformis Stretch with Foot on Ground  - 2 x daily - 7 x weekly - 3 reps - 30 sec hold - Standing Bilateral Gastroc Stretch with Step  - 2 x daily - 7 x weekly - 3 reps - 30 sec hold ASSESSMENT:  CLINICAL IMPRESSION: Completed 2 MWT and remaining hip muscle strength testing.  Pt able to complete 2 full laps with ambulation without complaints, however noted antalgia due to Rt knee pain and weakness as compared to Lt LE.  Rt hip mm were weaker for abductors and flexors as compared to Lt and also c/o Rt groin pain with testing.  Began stretches with noted tightness in hamstring and gastroc mm.  Added these to HEP to work on.  Pt most concerned with increased ease to navigate stairs and less locking up of knees with functional activities at work.  Reviewed goals and discussed POC moving forward to address these.  Pt able to complete all activities without increased pain at end of session.  Pt will continue to benefit from skilled therapy.   Patient is a 73 y.o. female who was seen today for physical therapy evaluation and treatment for bilateral knee pain.Patient demonstrates muscle weakness, reduced ROM, and fascial restrictions which are likely contributing to symptoms of pain and are negatively impacting patient ability to perform ADLs and functional mobility tasks. Patient will benefit from skilled physical therapy services to  address these deficits to reduce pain and improve level of function with ADLs and functional mobility tasks.   OBJECTIVE IMPAIRMENTS: Abnormal gait, decreased activity tolerance, decreased balance, decreased mobility, decreased ROM, decreased strength, increased fascial restrictions, impaired perceived functional ability, and pain.   ACTIVITY LIMITATIONS: carrying, lifting, bending, sitting, standing, squatting, sleeping, stairs, transfers, bathing, toileting, locomotion level, and caring for others  PARTICIPATION LIMITATIONS: meal prep, cleaning, laundry, shopping, community activity, occupation, and yard work  PERSONAL FACTORS: Profession are also affecting patient's functional outcome.   REHAB POTENTIAL: Good  CLINICAL DECISION MAKING: Evolving/moderate complexity  EVALUATION COMPLEXITY: Moderate   GOALS: Goals reviewed with patient? No  SHORT TERM GOALS: Target date: 05/22/2023 patient will be independent with initial HEP  Baseline: Goal status: INITIAL  2.  Patient will report 30% improvement overall  Baseline:  Goal status: INITIAL   LONG TERM GOALS: Target date: 06/12/2023  Patient will be independent in self management strategies to improve quality of life and functional outcomes.   Baseline:  Goal status: INITIAL  2.  Patient will report 50% improvement overall  Baseline:  Goal status: INITIAL  3.  Patient will improve LEFS score by 12  points or more to demonstrate improved perceived function  Baseline: 28/80 Goal status: INITIAL  4.  Patient will ambulate x 225 ft in 2 minutes with minimal gait deviation Baseline:  Goal status: INITIAL  5.  Patient will increase right leg MMT's to 5/5 to allow navigation of steps without gait deviation or loss of balance  Baseline: see above Goal  status: INITIAL   PLAN:  PT FREQUENCY: 1x/week  PT DURATION: 6 weeks  PLANNED INTERVENTIONS: 97164- PT Re-evaluation, 97110-Therapeutic exercises, 97530-  Therapeutic activity, 97112- Neuromuscular re-education, [phone]- Self Care, 38756- Manual therapy, [phone]- Gait training, [phone]- Orthotic Fit/training, [phone]- Canalith repositioning, [id number]- Aquatic Therapy, [phone]- Splinting, Patient/Family education, Balance training, Stair training, Taping, Dry Needling, Joint mobilization, Joint manipulation, Spinal manipulation, Spinal mobilization, Scar mobilization, and DME instructions.   PLAN FOR NEXT SESSION: continue to progress functional strength and stability. Progress standing exercises next session.   1:20 PM, 05/08/23 Lurena Nida, PTA/CLT [facility] Ph: [phone]

## 2023-05-15 ENCOUNTER — Ambulatory Visit (HOSPITAL_COMMUNITY): Payer: Medicare Other

## 2023-05-15 ENCOUNTER — Encounter (HOSPITAL_COMMUNITY): Payer: Self-pay

## 2023-05-15 DIAGNOSIS — R262 Difficulty in walking, not elsewhere classified: Secondary | ICD-10-CM

## 2023-05-15 DIAGNOSIS — M25561 Pain in right knee: Secondary | ICD-10-CM | POA: Diagnosis not present

## 2023-05-15 DIAGNOSIS — M25562 Pain in left knee: Secondary | ICD-10-CM | POA: Diagnosis not present

## 2023-05-15 DIAGNOSIS — G8929 Other chronic pain: Secondary | ICD-10-CM | POA: Diagnosis not present

## 2023-05-15 NOTE — Therapy (Signed)
 OUTPATIENT PHYSICAL THERAPY TREATMENT   Patient Name: Natalie Cowan MRN: [id number] DOB:1950-10-20, 73 y.o., female Today's Date: 05/15/2023  END OF SESSION:  PT End of Session - 05/15/23 0849     Visit Number 3    Number of Visits 6    Date for PT Re-Evaluation 06/12/23    Authorization Type Medicare    Progress Note Due on Visit 10    PT Start Time 0850    PT Stop Time 0933    PT Time Calculation (min) 43 min    Activity Tolerance Patient tolerated treatment well    Behavior During Therapy [facility] for tasks assessed/performed              History reviewed. No pertinent past medical history. Past Surgical History:  Procedure Laterality Date   CESAREAN SECTION     Patient Active Problem List   Diagnosis Date Noted   Atrophic vaginitis 02/25/2023   Lichen planus 10/17/2019   Raynaud disease 01/16/2017    PCP: Benita Stabile, MD  REFERRING PROVIDER: Lupita Raider, NP  REFERRING DIAG:  Free Text Diagnosis  PAIN IN RIGHT KNEE PAIN IN LEFT KNEE    THERAPY DIAG:  Chronic pain of both knees  Difficulty in walking, not elsewhere classified  Rationale for Evaluation and Treatment: Rehabilitation  ONSET DATE: chronic   SUBJECTIVE:   SUBJECTIVE STATEMENT: Pt stood from chair with a smile of achievement.  Pain scale normal, maybe 1/10 BLE Rt>Lt.  Reports a bulge on Lt achielles tendon is swollen and have lump, noticed when calf stretch on step.    Evaluation: Chronic knee pain bilaterally; severe arthritis in both knees; per Dr. Romeo Apple she needs both knees replaced.  She wants to wait until she retires to have her knees replaced.  She is a Administrator, [employer] and is on her feet quite a bit.  Knees buckle sometimes; has difficulty with managing curbs; has multiple horses and dogs of her own she cares for her; has not been able to ride her horses and has gained weight. Cannot get down or up out of the floor  PERTINENT HISTORY: Very busy;  Sprained right ankle multiple  times PAIN:  Are you having pain? Yes: NPRS scale: 0-9 Pain location: both knees Pain description: aching Aggravating factors: getting up from sitting Relieving factors: NSAIDS  PRECAUTIONS: None     WEIGHT BEARING RESTRICTIONS: No  FALLS:  Has patient fallen in last 6 months? Yes. Number of falls 1  OCCUPATION: vet  PLOF: Independent  PATIENT GOALS: come up with a plan to manage knee pain and instability until   NEXT MD VISIT: PRN  OBJECTIVE:  Note: Objective measures were completed at Evaluation unless otherwise noted.  DIAGNOSTIC FINDINGS:  Pain left knee   Grade 4 arthritis left knee   Findings include joint space narrowing medially with bone-on-bone contact secondary bone changes including sclerosis cyst formation osteophyte formation all 3 compartments involved varus alignment noted.   Right and left knee images   The right knee is in varus and it has grade 4 degenerative changes in the medial compartment with sclerosis cyst formation and small osteophyte.   Similar findings are noted on the left   Patellofemoral joints again degenerative changes narrowing of the medial patellofemoral joint with osteophyte formation in the right knee   And then medial lateral osteophytes patellofemoral joint left knee   Lateral imaging left and right knee show the extensive medial compartment degenerative changes with bone-on-bone contact on the  left knee and less than half millimeter of space on the right knee   Severe arthritis grade 4 both knees all 3 compartments with varus alignment     PATIENT SURVEYS:  LEFS 9.74 using hands to assist up  COGNITION: Overall cognitive status: Within functional limits for tasks assessed     Denies N/T  Some swelling noted  PALPATION: Minimal tenderness; noted decreased patella mobility bilaterally  LOWER EXTREMITY ROM:  Active ROM Right eval Left eval  Hip flexion    Hip extension    Hip abduction    Hip adduction     Hip internal rotation    Hip external rotation    Knee flexion 121 110  Knee extension 0 -4  Ankle dorsiflexion    Ankle plantarflexion    Ankle inversion    Ankle eversion     (Blank rows = not tested)  LOWER EXTREMITY MMT:  MMT Right eval Left eval  Hip flexion 4 4+  Hip extension 3+*  4-  Hip abduction 3+* 4-  Hip adduction 4 4  Hip internal rotation    Hip external rotation    Knee flexion(tested in midrange sitting) 4+ 4+  Knee extension 4- 4+  Ankle dorsiflexion 5 5  Ankle plantarflexion    Ankle inversion    Ankle eversion     (Blank rows = not tested) * with pain in groin with MMT  FUNCTIONAL TESTS:  5 times sit to stand: 9.89 sec using hands to assist  GAIT: Distance walked: 50 ft in clinic Assistive device utilized: None Level of assistance: Modified independence Comments: antalgic gait; furniture walks                                                                                                                                TREATMENT DATE: 05/15/23: Standing: Heel raises 10x Toe raises 10x  Sit to stand 10x eccentric control Abduction with HHA 10 Minisquats front of chair 10x  Tandem stance 2x 30" 4in step up 10x Sidelying: Abduction 10x    05/08/23 Bil LE quadsets 10X5" Bil LE SLR 10X each Bridge 10X Hamstring stretch 2X30" with strap Piriformis stretch supine 2X30" each Standing gastroc stretch on step 2X30" each Goal review, POC moving forward  05/01/23 physical therapy evaluation and HEP instruction; trial of kinesiotape to right knee for support   PATIENT EDUCATION:  Education details: Patient educated on exam findings, POC, scope of PT, HEP, and trial of kinesiotape. Person educated: Patient Education method: Explanation, Demonstration, and Handouts Education comprehension: verbalized understanding, returned demonstration, verbal cues required, and tactile cues required  HOME EXERCISE PROGRAM: Access Code: [email or web address] URL:  [email or web address] Date: 05/01/2023 Prepared by: AP - Rehab  Exercises - Supine Quad Set  - 2 x daily - 7 x weekly - 1 sets - 10 reps - Active Straight Leg Raise with Quad Set  - 2 x daily - 7 x weekly - 1 sets - 10  reps - gentle 4 way patellar glides  - 2 x daily - 7 x weekly - 1 sets - 10 reps Access Code: [email or web address] URL: [email or web address] Date: 05/08/2023 Prepared by: Emeline Gins  Exercises - Supine Quad Set  - 2 x daily - 7 x weekly - 1 sets - 10 reps - Active Straight Leg Raise with Quad Set  - 2 x daily - 7 x weekly - 1 sets - 10 reps - gentle 4 way patellar glides  - 2 x daily - 7 x weekly - 1 sets - 10 reps - Supine Bridge  - 2 x daily - 7 x weekly - 10 reps - Hooklying Hamstring Stretch with Strap  - 2 x daily - 7 x weekly - 3 reps - 30 sec hold - Supine Piriformis Stretch with Foot on Ground  - 2 x daily - 7 x weekly - 3 reps - 30 sec hold - Standing Bilateral Gastroc Stretch with Step  - 2 x daily - 7 x weekly - 3 reps - 30 sec hold Access Code: [email or web address] URL: [email or web address] Date: 05/15/2023 Prepared by: Becky Sax  - Sidelying Hip Abduction  - 2 x daily - 7 x weekly - 2 sets - 10 reps - 3" hold ASSESSMENT:  CLINICAL IMPRESSION: Progressed to standing for functional strengthening.  Pt presents with weak glut with difficulty with SLS during abduction exercise.  Added sidelying abduction to HEP for strengthening with printout given and verbalized understanding.  Progressed to step up training with popping and some Rt knee pain, educated on importance of controlled movements for maximal benefits.  Balance activities added with good tolerance on static floor, increased difficulty with dynamic surface with intermittent HHA required.  Pt tolerated well to session with no reports of increased pain through session.  Patient is a 73 y.o. female who was seen today for physical therapy evaluation and treatment for bilateral  knee pain.Patient demonstrates muscle weakness, reduced ROM, and fascial restrictions which are likely contributing to symptoms of pain and are negatively impacting patient ability to perform ADLs and functional mobility tasks. Patient will benefit from skilled physical therapy services to address these deficits to reduce pain and improve level of function with ADLs and functional mobility tasks.   OBJECTIVE IMPAIRMENTS: Abnormal gait, decreased activity tolerance, decreased balance, decreased mobility, decreased ROM, decreased strength, increased fascial restrictions, impaired perceived functional ability, and pain.   ACTIVITY LIMITATIONS: carrying, lifting, bending, sitting, standing, squatting, sleeping, stairs, transfers, bathing, toileting, locomotion level, and caring for others  PARTICIPATION LIMITATIONS: meal prep, cleaning, laundry, shopping, community activity, occupation, and yard work  PERSONAL FACTORS: Profession are also affecting patient's functional outcome.   REHAB POTENTIAL: Good  CLINICAL DECISION MAKING: Evolving/moderate complexity  EVALUATION COMPLEXITY: Moderate   GOALS: Goals reviewed with patient? No  SHORT TERM GOALS: Target date: 05/22/2023 patient will be independent with initial HEP  Baseline: Goal status: INITIAL  2.  Patient will report 30% improvement overall  Baseline:  Goal status: INITIAL   LONG TERM GOALS: Target date: 06/12/2023  Patient will be independent in self management strategies to improve quality of life and functional outcomes.   Baseline:  Goal status: INITIAL  2.  Patient will report 50% improvement overall  Baseline:  Goal status: INITIAL  3.  Patient will improve LEFS score by 12  points or more to demonstrate improved perceived function  Baseline: 28/80 Goal status: INITIAL  4.  Patient will ambulate x 225 ft in 2 minutes with minimal gait  deviation Baseline:  Goal status: INITIAL  5.  Patient will increase  right leg MMT's to 5/5 to allow navigation of steps without gait deviation or loss of balance  Baseline: see above Goal status: INITIAL   PLAN:  PT FREQUENCY: 1x/week  PT DURATION: 6 weeks  PLANNED INTERVENTIONS: 97164- PT Re-evaluation, 97110-Therapeutic exercises, 97530- Therapeutic activity, 97112- Neuromuscular re-education, 97535- Self Care, 96045- Manual therapy, [phone]- Gait training, [phone]- Orthotic Fit/training, [phone]- Canalith repositioning, [id number]- Aquatic Therapy, [phone]- Splinting, Patient/Family education, Balance training, Stair training, Taping, Dry Needling, Joint mobilization, Joint manipulation, Spinal manipulation, Spinal mobilization, Scar mobilization, and DME instructions.   PLAN FOR NEXT SESSION: continue to progress functional strength and stability. Progress standing exercises next session.  Becky Sax, LPTA/CLT; CBIS [phone]  10:51 AM, 05/15/23

## 2023-05-22 ENCOUNTER — Encounter (HOSPITAL_COMMUNITY): Payer: Medicare Other | Admitting: Physical Therapy

## 2023-05-22 ENCOUNTER — Encounter (HOSPITAL_COMMUNITY): Payer: Self-pay

## 2023-05-22 ENCOUNTER — Ambulatory Visit (HOSPITAL_COMMUNITY): Payer: Medicare Other | Attending: Family Medicine

## 2023-05-22 DIAGNOSIS — G8929 Other chronic pain: Secondary | ICD-10-CM | POA: Diagnosis not present

## 2023-05-22 DIAGNOSIS — M25562 Pain in left knee: Secondary | ICD-10-CM | POA: Insufficient documentation

## 2023-05-22 DIAGNOSIS — R262 Difficulty in walking, not elsewhere classified: Secondary | ICD-10-CM | POA: Insufficient documentation

## 2023-05-22 DIAGNOSIS — M25561 Pain in right knee: Secondary | ICD-10-CM | POA: Diagnosis not present

## 2023-05-22 NOTE — Therapy (Signed)
 OUTPATIENT PHYSICAL THERAPY TREATMENT   Patient Name: Natalie Cowan MRN: [id number] DOB:04-Oct-1950, 73 y.o., female Today's Date: 05/22/2023  END OF SESSION:  PT End of Session - 05/22/23 0803     Visit Number 4    Number of Visits 6    Date for PT Re-Evaluation 06/12/23    Authorization Type Medicare    Progress Note Due on Visit 10    PT Start Time 0804    PT Stop Time 0845    PT Time Calculation (min) 41 min    Activity Tolerance Patient tolerated treatment well    Behavior During Therapy [facility] for tasks assessed/performed              History reviewed. No pertinent past medical history. Past Surgical History:  Procedure Laterality Date   CESAREAN SECTION     Patient Active Problem List   Diagnosis Date Noted   Atrophic vaginitis 02/25/2023   Lichen planus 10/17/2019   Raynaud disease 01/16/2017    PCP: Benita Stabile, MD  REFERRING PROVIDER: Lupita Raider, NP  REFERRING DIAG:  Free Text Diagnosis  PAIN IN RIGHT KNEE PAIN IN LEFT KNEE    THERAPY DIAG:  Chronic pain of both knees  Difficulty in walking, not elsewhere classified  Rationale for Evaluation and Treatment: Rehabilitation  ONSET DATE: chronic   SUBJECTIVE:   SUBJECTIVE STATEMENT: Rt knee pain scale 2/10.  Reports she has an apt with Dr Despina Hick on May 1st.  Reports decreased swelling Lt achilles area compares to last session.  Evaluation: Chronic knee pain bilaterally; severe arthritis in both knees; per Dr. Romeo Apple she needs both knees replaced.  She wants to wait until she retires to have her knees replaced.  She is a Administrator, [employer] and is on her feet quite a bit.  Knees buckle sometimes; has difficulty with managing curbs; has multiple horses and dogs of her own she cares for her; has not been able to ride her horses and has gained weight. Cannot get down or up out of the floor  PERTINENT HISTORY: Very busy;  Sprained right ankle multiple times PAIN:  Are you having pain? Yes: NPRS scale:  0-9 Pain location: both knees Pain description: aching Aggravating factors: getting up from sitting Relieving factors: NSAIDS  PRECAUTIONS: None     WEIGHT BEARING RESTRICTIONS: No  FALLS:  Has patient fallen in last 6 months? Yes. Number of falls 1  OCCUPATION: vet  PLOF: Independent  PATIENT GOALS: come up with a plan to manage knee pain and instability until   NEXT MD VISIT: PRN  OBJECTIVE:  Note: Objective measures were completed at Evaluation unless otherwise noted.  DIAGNOSTIC FINDINGS:  Pain left knee   Grade 4 arthritis left knee   Findings include joint space narrowing medially with bone-on-bone contact secondary bone changes including sclerosis cyst formation osteophyte formation all 3 compartments involved varus alignment noted.   Right and left knee images   The right knee is in varus and it has grade 4 degenerative changes in the medial compartment with sclerosis cyst formation and small osteophyte.   Similar findings are noted on the left   Patellofemoral joints again degenerative changes narrowing of the medial patellofemoral joint with osteophyte formation in the right knee   And then medial lateral osteophytes patellofemoral joint left knee   Lateral imaging left and right knee show the extensive medial compartment degenerative changes with bone-on-bone contact on the left knee and less than half millimeter of space on  the right knee   Severe arthritis grade 4 both knees all 3 compartments with varus alignment     PATIENT SURVEYS:  LEFS 9.74 using hands to assist up  COGNITION: Overall cognitive status: Within functional limits for tasks assessed     Denies N/T  Some swelling noted  PALPATION: Minimal tenderness; noted decreased patella mobility bilaterally  LOWER EXTREMITY ROM:  Active ROM Right eval Left eval  Hip flexion    Hip extension    Hip abduction    Hip adduction    Hip internal rotation    Hip external rotation     Knee flexion 121 110  Knee extension 0 -4  Ankle dorsiflexion    Ankle plantarflexion    Ankle inversion    Ankle eversion     (Blank rows = not tested)  LOWER EXTREMITY MMT:  MMT Right eval Left eval  Hip flexion 4 4+  Hip extension 3+*  4-  Hip abduction 3+* 4-  Hip adduction 4 4  Hip internal rotation    Hip external rotation    Knee flexion(tested in midrange sitting) 4+ 4+  Knee extension 4- 4+  Ankle dorsiflexion 5 5  Ankle plantarflexion    Ankle inversion    Ankle eversion     (Blank rows = not tested) * with pain in groin with MMT  FUNCTIONAL TESTS:  5 times sit to stand: 9.89 sec using hands to assist  GAIT: Distance walked: 50 ft in clinic Assistive device utilized: None Level of assistance: Modified independence Comments: antalgic gait; furniture walks                                                                                                                                TREATMENT DATE: 05/22/23: Bike 5' seat 9 Minisquats front of chair 12x Heel raises 15x Toe raises 15x Step up 4 then 6in 10x  Sidestep with GTB around thigh 3RT Tandem stance on foam 3x 30" Vector stance 3x 5" with UE A Lateral step up 4in 10x with UE A  05/15/23: Standing: Heel raises 10x Toe raises 10x  Sit to stand 10x eccentric control Abduction with HHA 10 Minisquats front of chair 10x  Tandem stance 2x 30" 4in step up 10x Sidelying: Abduction 10x    05/08/23 Bil LE quadsets 10X5" Bil LE SLR 10X each Bridge 10X Hamstring stretch 2X30" with strap Piriformis stretch supine 2X30" each Standing gastroc stretch on step 2X30" each Goal review, POC moving forward  05/01/23 physical therapy evaluation and HEP instruction; trial of kinesiotape to right knee for support   PATIENT EDUCATION:  Education details: Patient educated on exam findings, POC, scope of PT, HEP, and trial of kinesiotape. Person educated: Patient Education method: Explanation,  Demonstration, and Handouts Education comprehension: verbalized understanding, returned demonstration, verbal cues required, and tactile cues required  HOME EXERCISE PROGRAM: Access Code: [email or web address] URL: [email or web address] Date: 05/01/2023 Prepared by: AP - Rehab  Exercises -  Supine Quad Set  - 2 x daily - 7 x weekly - 1 sets - 10 reps - Active Straight Leg Raise with Quad Set  - 2 x daily - 7 x weekly - 1 sets - 10 reps - gentle 4 way patellar glides  - 2 x daily - 7 x weekly - 1 sets - 10 reps Access Code: [email or web address] URL: [email or web address] Date: 05/08/2023 Prepared by: Emeline Gins  Exercises - Supine Quad Set  - 2 x daily - 7 x weekly - 1 sets - 10 reps - Active Straight Leg Raise with Quad Set  - 2 x daily - 7 x weekly - 1 sets - 10 reps - gentle 4 way patellar glides  - 2 x daily - 7 x weekly - 1 sets - 10 reps - Supine Bridge  - 2 x daily - 7 x weekly - 10 reps - Hooklying Hamstring Stretch with Strap  - 2 x daily - 7 x weekly - 3 reps - 30 sec hold - Supine Piriformis Stretch with Foot on Ground  - 2 x daily - 7 x weekly - 3 reps - 30 sec hold - Standing Bilateral Gastroc Stretch with Step  - 2 x daily - 7 x weekly - 3 reps - 30 sec hold Access Code: [email or web address] URL: [email or web address] Date: 05/15/2023 Prepared by: Becky Sax  - Sidelying Hip Abduction  - 2 x daily - 7 x weekly - 2 sets - 10 reps - 3" hold  05/22/23: sidestep with GTB and vector stance  ASSESSMENT:  CLINICAL IMPRESSION: Progressed functional strengthening with additional hip stability exercises and step up training.  Pt presents with increased challenge Rt LE during lateral step up with crepitus noted and increased UE support required for proper form and safety.  Added vector stance and sidestep to HEP with printout given and encouraged to complete in safe location near counter at home.  Patient is a 73 y.o. female who was seen today for physical therapy  evaluation and treatment for bilateral knee pain.Patient demonstrates muscle weakness, reduced ROM, and fascial restrictions which are likely contributing to symptoms of pain and are negatively impacting patient ability to perform ADLs and functional mobility tasks. Patient will benefit from skilled physical therapy services to address these deficits to reduce pain and improve level of function with ADLs and functional mobility tasks.   OBJECTIVE IMPAIRMENTS: Abnormal gait, decreased activity tolerance, decreased balance, decreased mobility, decreased ROM, decreased strength, increased fascial restrictions, impaired perceived functional ability, and pain.   ACTIVITY LIMITATIONS: carrying, lifting, bending, sitting, standing, squatting, sleeping, stairs, transfers, bathing, toileting, locomotion level, and caring for others  PARTICIPATION LIMITATIONS: meal prep, cleaning, laundry, shopping, community activity, occupation, and yard work  PERSONAL FACTORS: Profession are also affecting patient's functional outcome.   REHAB POTENTIAL: Good  CLINICAL DECISION MAKING: Evolving/moderate complexity  EVALUATION COMPLEXITY: Moderate   GOALS: Goals reviewed with patient? No  SHORT TERM GOALS: Target date: 05/22/2023 patient will be independent with initial HEP  Baseline: Goal status: INITIAL  2.  Patient will report 30% improvement overall  Baseline:  Goal status: INITIAL   LONG TERM GOALS: Target date: 06/12/2023  Patient will be independent in self management strategies to improve quality of life and functional outcomes.   Baseline:  Goal status: INITIAL  2.  Patient will report 50% improvement overall  Baseline:  Goal status: INITIAL  3.  Patient will improve LEFS score by 12  points or more to demonstrate improved perceived function  Baseline: 28/80 Goal status: INITIAL  4.  Patient will ambulate x 225 ft in 2 minutes with minimal gait deviation Baseline:  Goal status:  INITIAL  5.  Patient will increase right leg MMT's to 5/5 to allow navigation of steps without gait deviation or loss of balance  Baseline: see above Goal status: INITIAL   PLAN:  PT FREQUENCY: 1x/week  PT DURATION: 6 weeks  PLANNED INTERVENTIONS: 97164- PT Re-evaluation, 97110-Therapeutic exercises, 97530- Therapeutic activity, 97112- Neuromuscular re-education, 97535- Self Care, 40981- Manual therapy, [phone]- Gait training, [phone]- Orthotic Fit/training, [phone]- Canalith repositioning, [id number]- Aquatic Therapy, [phone]- Splinting, Patient/Family education, Balance training, Stair training, Taping, Dry Needling, Joint mobilization, Joint manipulation, Spinal manipulation, Spinal mobilization, Scar mobilization, and DME instructions.   PLAN FOR NEXT SESSION: continue to progress functional strength and stability. Progress standing exercises next session.  Becky Sax, LPTA/CLT; CBIS [phone]  8:57 AM, 05/22/23

## 2023-05-29 ENCOUNTER — Ambulatory Visit (HOSPITAL_COMMUNITY): Payer: Medicare Other

## 2023-05-29 ENCOUNTER — Encounter (HOSPITAL_COMMUNITY): Payer: Self-pay

## 2023-05-29 DIAGNOSIS — M25561 Pain in right knee: Secondary | ICD-10-CM | POA: Diagnosis not present

## 2023-05-29 DIAGNOSIS — M25562 Pain in left knee: Secondary | ICD-10-CM | POA: Diagnosis not present

## 2023-05-29 DIAGNOSIS — G8929 Other chronic pain: Secondary | ICD-10-CM | POA: Diagnosis not present

## 2023-05-29 DIAGNOSIS — R262 Difficulty in walking, not elsewhere classified: Secondary | ICD-10-CM | POA: Diagnosis not present

## 2023-05-29 NOTE — Therapy (Signed)
 OUTPATIENT PHYSICAL THERAPY TREATMENT   Patient Name: Natalie Cowan MRN: [id number] DOB:Jul 10, 1950, 73 y.o., female Today's Date: 05/29/2023  END OF SESSION:  PT End of Session - 05/29/23 0846     Visit Number 5    Number of Visits 6    Date for PT Re-Evaluation 06/12/23    Authorization Type Medicare    Progress Note Due on Visit 10    PT Start Time 0846    PT Stop Time 0926    PT Time Calculation (min) 40 min    Activity Tolerance Patient tolerated treatment well    Behavior During Therapy [facility] for tasks assessed/performed               History reviewed. No pertinent past medical history. Past Surgical History:  Procedure Laterality Date   CESAREAN SECTION     Patient Active Problem List   Diagnosis Date Noted   Atrophic vaginitis 02/25/2023   Lichen planus 10/17/2019   Raynaud disease 01/16/2017    PCP: Benita Stabile, MD  REFERRING PROVIDER: Lupita Raider, NP  REFERRING DIAG:  Free Text Diagnosis  PAIN IN RIGHT KNEE PAIN IN LEFT KNEE    THERAPY DIAG:  Chronic pain of both knees  Difficulty in walking, not elsewhere classified  Rationale for Evaluation and Treatment: Rehabilitation  ONSET DATE: chronic   SUBJECTIVE:   SUBJECTIVE STATEMENT:   Patient reports feeling pretty good today. Took Tylenol prior to arrival which usually helps with the pain.  Evaluation: Chronic knee pain bilaterally; severe arthritis in both knees; per Dr. Romeo Apple she needs both knees replaced.  She wants to wait until she retires to have her knees replaced.  She is a Administrator, [employer] and is on her feet quite a bit.  Knees buckle sometimes; has difficulty with managing curbs; has multiple horses and dogs of her own she cares for her; has not been able to ride her horses and has gained weight. Cannot get down or up out of the floor  PERTINENT HISTORY: Very busy;  Sprained right ankle multiple times PAIN:  Are you having pain? Yes: NPRS scale: 0-9 Pain location: both knees Pain  description: aching Aggravating factors: getting up from sitting Relieving factors: NSAIDS  PRECAUTIONS: None     WEIGHT BEARING RESTRICTIONS: No  FALLS:  Has patient fallen in last 6 months? Yes. Number of falls 1  OCCUPATION: vet  PLOF: Independent  PATIENT GOALS: come up with a plan to manage knee pain and instability until   NEXT MD VISIT: PRN  OBJECTIVE:  Note: Objective measures were completed at Evaluation unless otherwise noted.  DIAGNOSTIC FINDINGS:  Pain left knee   Grade 4 arthritis left knee   Findings include joint space narrowing medially with bone-on-bone contact secondary bone changes including sclerosis cyst formation osteophyte formation all 3 compartments involved varus alignment noted.   Right and left knee images   The right knee is in varus and it has grade 4 degenerative changes in the medial compartment with sclerosis cyst formation and small osteophyte.   Similar findings are noted on the left   Patellofemoral joints again degenerative changes narrowing of the medial patellofemoral joint with osteophyte formation in the right knee   And then medial lateral osteophytes patellofemoral joint left knee   Lateral imaging left and right knee show the extensive medial compartment degenerative changes with bone-on-bone contact on the left knee and less than half millimeter of space on the right knee   Severe arthritis grade  4 both knees all 3 compartments with varus alignment     PATIENT SURVEYS:  LEFS 9.74 using hands to assist up  COGNITION: Overall cognitive status: Within functional limits for tasks assessed     Denies N/T  Some swelling noted  PALPATION: Minimal tenderness; noted decreased patella mobility bilaterally  LOWER EXTREMITY ROM:  Active ROM Right eval Left eval  Hip flexion    Hip extension    Hip abduction    Hip adduction    Hip internal rotation    Hip external rotation    Knee flexion 121 110  Knee extension  0 -4  Ankle dorsiflexion    Ankle plantarflexion    Ankle inversion    Ankle eversion     (Blank rows = not tested)  LOWER EXTREMITY MMT:  MMT Right eval Left eval  Hip flexion 4 4+  Hip extension 3+*  4-  Hip abduction 3+* 4-  Hip adduction 4 4  Hip internal rotation    Hip external rotation    Knee flexion(tested in midrange sitting) 4+ 4+  Knee extension 4- 4+  Ankle dorsiflexion 5 5  Ankle plantarflexion    Ankle inversion    Ankle eversion     (Blank rows = not tested) * with pain in groin with MMT  FUNCTIONAL TESTS:  5 times sit to stand: 9.89 sec using hands to assist  GAIT: Distance walked: 50 ft in clinic Assistive device utilized: None Level of assistance: Modified independence Comments: antalgic gait; furniture walks                                                                                                                                TREATMENT DATE: 05/29/23: Bike x 5 minutes (seat 9) Mini squats with B UE support in // bars 2 x 15  Dynamic high marching with 2# AW in // bars x 5 laps Standing hip abduction with 2# AW 2 x 15 each LE with B UE support  Seated LAQ 2# AW 2 x 10 with 3 second hold  6" step up 2 x 10 leading with each LE with single UE support 4" step down with single rail x 10 each LE   05/22/23: Bike 5' seat 9 Minisquats front of chair 12x Heel raises 15x Toe raises 15x Step up 4 then 6in 10x  Sidestep with GTB around thigh 3RT Tandem stance on foam 3x 30" Vector stance 3x 5" with UE A Lateral step up 4in 10x with UE A  05/15/23: Standing: Heel raises 10x Toe raises 10x  Sit to stand 10x eccentric control Abduction with HHA 10 Minisquats front of chair 10x  Tandem stance 2x 30" 4in step up 10x Sidelying: Abduction 10x    05/08/23 Bil LE quadsets 10X5" Bil LE SLR 10X each Bridge 10X Hamstring stretch 2X30" with strap Piriformis stretch supine 2X30" each Standing gastroc stretch on step 2X30" each Goal review,  POC moving  forward  05/01/23 physical therapy evaluation and HEP instruction; trial of kinesiotape to right knee for support   PATIENT EDUCATION:  Education details: Patient educated on exam findings, POC, scope of PT, HEP, and trial of kinesiotape. Person educated: Patient Education method: Explanation, Demonstration, and Handouts Education comprehension: verbalized understanding, returned demonstration, verbal cues required, and tactile cues required  HOME EXERCISE PROGRAM: Access Code: [email or web address] URL: [email or web address] Date: 05/01/2023 Prepared by: [facility] - Rehab  Exercises - Supine Quad Set  - 2 x daily - 7 x weekly - 1 sets - 10 reps - Active Straight Leg Raise with Quad Set  - 2 x daily - 7 x weekly - 1 sets - 10 reps - gentle 4 way patellar glides  - 2 x daily - 7 x weekly - 1 sets - 10 reps Access Code: [email or web address] URL: [email or web address] Date: 05/08/2023 Prepared by: Emeline Gins  Exercises - Supine Quad Set  - 2 x daily - 7 x weekly - 1 sets - 10 reps - Active Straight Leg Raise with Quad Set  - 2 x daily - 7 x weekly - 1 sets - 10 reps - gentle 4 way patellar glides  - 2 x daily - 7 x weekly - 1 sets - 10 reps - Supine Bridge  - 2 x daily - 7 x weekly - 10 reps - Hooklying Hamstring Stretch with Strap  - 2 x daily - 7 x weekly - 3 reps - 30 sec hold - Supine Piriformis Stretch with Foot on Ground  - 2 x daily - 7 x weekly - 3 reps - 30 sec hold - Standing Bilateral Gastroc Stretch with Step  - 2 x daily - 7 x weekly - 3 reps - 30 sec hold Access Code: [email or web address] URL: [email or web address] Date: 05/15/2023 Prepared by: Becky Sax  - Sidelying Hip Abduction  - 2 x daily - 7 x weekly - 2 sets - 10 reps - 3" hold  05/22/23: sidestep with GTB and vector stance  ASSESSMENT:  CLINICAL IMPRESSION:   Continues to remain motivated to strengthen BLE and reduce pain. Session focused on BLE functional strengthening. Continues to have  difficulty with single leg activities on R. Introduced step downs due to difficulty descending stairs. Patient will continue to benefit from skilled therapy to address remaining deficits in order to improve quality of life and return to PLOF.    Eval: Patient is a 73 y.o. female who was seen today for physical therapy evaluation and treatment for bilateral knee pain.Patient demonstrates muscle weakness, reduced ROM, and fascial restrictions which are likely contributing to symptoms of pain and are negatively impacting patient ability to perform ADLs and functional mobility tasks. Patient will benefit from skilled physical therapy services to address these deficits to reduce pain and improve level of function with ADLs and functional mobility tasks.   OBJECTIVE IMPAIRMENTS: Abnormal gait, decreased activity tolerance, decreased balance, decreased mobility, decreased ROM, decreased strength, increased fascial restrictions, impaired perceived functional ability, and pain.   ACTIVITY LIMITATIONS: carrying, lifting, bending, sitting, standing, squatting, sleeping, stairs, transfers, bathing, toileting, locomotion level, and caring for others  PARTICIPATION LIMITATIONS: meal prep, cleaning, laundry, shopping, community activity, occupation, and yard work  PERSONAL FACTORS: Profession are also affecting patient's functional outcome.   REHAB POTENTIAL: Good  CLINICAL DECISION MAKING: Evolving/moderate complexity  EVALUATION COMPLEXITY: Moderate   GOALS: Goals reviewed with patient? No  SHORT TERM GOALS: Target date: 05/22/2023 patient will be independent with initial HEP  Baseline: Goal status: INITIAL  2.  Patient will report 30% improvement overall  Baseline:  Goal status: INITIAL   LONG TERM GOALS: Target date: 06/12/2023  Patient will be independent in self management strategies to improve quality of life and functional outcomes.   Baseline:  Goal status: INITIAL  2.  Patient  will report 50% improvement overall  Baseline:  Goal status: INITIAL  3.  Patient will improve LEFS score by 12  points or more to demonstrate improved perceived function  Baseline: 28/80 Goal status: INITIAL  4.  Patient will ambulate x 225 ft in 2 minutes with minimal gait deviation Baseline:  Goal status: INITIAL  5.  Patient will increase right leg MMT's to 5/5 to allow navigation of steps without gait deviation or loss of balance  Baseline: see above Goal status: INITIAL   PLAN:  PT FREQUENCY: 1x/week  PT DURATION: 6 weeks  PLANNED INTERVENTIONS: 97164- PT Re-evaluation, 97110-Therapeutic exercises, 97530- Therapeutic activity, 97112- Neuromuscular re-education, 97535- Self Care, 78295- Manual therapy, [phone]- Gait training, [phone]- Orthotic Fit/training, [phone]- Canalith repositioning, [id number]- Aquatic Therapy, [phone]- Splinting, Patient/Family education, Balance training, Stair training, Taping, Dry Needling, Joint mobilization, Joint manipulation, Spinal manipulation, Spinal mobilization, Scar mobilization, and DME instructions.   PLAN FOR NEXT SESSION: continue to progress functional strength and stability. Progress standing exercises next session. Step downs!  Maylon Peppers, PT, DPT  8:46 AM, 05/29/23

## 2023-06-05 ENCOUNTER — Encounter (HOSPITAL_COMMUNITY): Payer: Medicare Other

## 2023-06-12 ENCOUNTER — Ambulatory Visit (HOSPITAL_COMMUNITY): Payer: Medicare Other

## 2023-06-12 ENCOUNTER — Encounter (HOSPITAL_COMMUNITY): Payer: Self-pay

## 2023-06-12 DIAGNOSIS — M25561 Pain in right knee: Secondary | ICD-10-CM | POA: Diagnosis not present

## 2023-06-12 DIAGNOSIS — R262 Difficulty in walking, not elsewhere classified: Secondary | ICD-10-CM

## 2023-06-12 DIAGNOSIS — G8929 Other chronic pain: Secondary | ICD-10-CM

## 2023-06-12 DIAGNOSIS — M25562 Pain in left knee: Secondary | ICD-10-CM | POA: Diagnosis not present

## 2023-06-12 NOTE — Therapy (Addendum)
 OUTPATIENT PHYSICAL THERAPY TREATMENT/Discharge  PHYSICAL THERAPY DISCHARGE SUMMARY  Visits from Start of Care: 6  Current functional level related to goals / functional outcomes: See below   Remaining deficits: See below   Education / Equipment: See below   Patient agrees to discharge. Patient goals were met. Patient is being discharged due to meeting the stated rehab goals.   Patient Name: Natalie Cowan MRN: [id number] DOB:[birth year], 73 y.o., female Today's Date: 06/12/2023  END OF SESSION:  PT End of Session - 06/12/23 0937     Visit Number 6    Number of Visits 6    Date for PT Re-Evaluation 06/12/23    Authorization Type Medicare    PT Start Time 0935    PT Stop Time 1023    PT Time Calculation (min) 48 min    Activity Tolerance Patient tolerated treatment well    Behavior During Therapy [facility] for tasks assessed/performed               History reviewed. No pertinent past medical history. Past Surgical History:  Procedure Laterality Date   CESAREAN SECTION     Patient Active Problem List   Diagnosis Date Noted   Atrophic vaginitis 02/25/2023   Lichen planus 10/17/2019   Raynaud disease 01/16/2017    PCP: Benita Stabile, MD  REFERRING PROVIDER: Lupita Raider, NP  REFERRING DIAG:  Free Text Diagnosis  PAIN IN RIGHT KNEE PAIN IN LEFT KNEE    THERAPY DIAG:  Chronic pain of both knees  Difficulty in walking, not elsewhere classified  Rationale for Evaluation and Treatment: Rehabilitation  ONSET DATE: chronic   SUBJECTIVE:   SUBJECTIVE STATEMENT:   Pt reports she feels good today. Little to no pain. Can feel dull/achy feeling that may be from driving and sitting in her truck. Stairs are still a little difficult but getting better   Evaluation: Chronic knee pain bilaterally; severe arthritis in both knees; per Dr. Romeo Apple she needs both knees replaced.  She wants to wait until she retires to have her knees replaced.  She is a Administrator, [employer] and  is on her feet quite a bit.  Knees buckle sometimes; has difficulty with managing curbs; has multiple horses and dogs of her own she cares for her; has not been able to ride her horses and has gained weight. Cannot get down or up out of the floor  PERTINENT HISTORY: Very busy;  Sprained right ankle multiple times PAIN:  Are you having pain? Yes: NPRS scale: 0-9 Pain location: both knees Pain description: aching Aggravating factors: getting up from sitting Relieving factors: NSAIDS  PRECAUTIONS: None     WEIGHT BEARING RESTRICTIONS: No  FALLS:  Has patient fallen in last 6 months? Yes. Number of falls 1  OCCUPATION: vet  PLOF: Independent  PATIENT GOALS: come up with a plan to manage knee pain and instability until   NEXT MD VISIT: PRN  OBJECTIVE:  Note: Objective measures were completed at Evaluation unless otherwise noted.  DIAGNOSTIC FINDINGS:  Pain left knee   Grade 4 arthritis left knee   Findings include joint space narrowing medially with bone-on-bone contact secondary bone changes including sclerosis cyst formation osteophyte formation all 3 compartments involved varus alignment noted.   Right and left knee images   The right knee is in varus and it has grade 4 degenerative changes in the medial compartment with sclerosis cyst formation and small osteophyte.   Similar findings are noted on the left   Patellofemoral  joints again degenerative changes narrowing of the medial patellofemoral joint with osteophyte formation in the right knee   And then medial lateral osteophytes patellofemoral joint left knee   Lateral imaging left and right knee show the extensive medial compartment degenerative changes with bone-on-bone contact on the left knee and less than half millimeter of space on the right knee   Severe arthritis grade 4 both knees all 3 compartments with varus alignment     PATIENT SURVEYS:  LEFS 9.74 using hands to assist up 06/12/23 LEFS 55 / 80 =  68.8 %  COGNITION: Overall cognitive status: Within functional limits for tasks assessed     Denies N/T  Some swelling noted  PALPATION: Minimal tenderness; noted decreased patella mobility bilaterally  LOWER EXTREMITY ROM:  Active ROM Right eval Left eval R  06/12/2023  L 06/12/2023   Hip flexion      Hip extension      Hip abduction      Hip adduction      Hip internal rotation      Hip external rotation      Knee flexion 121 110 125 121  Knee extension 0 -4 0 0  Ankle dorsiflexion      Ankle plantarflexion      Ankle inversion      Ankle eversion       (Blank rows = not tested)  LOWER EXTREMITY MMT:  MMT Right eval Left eval R 06/12/2023  L 06/12/2023   Hip flexion 4 4+ 4+ 5  Hip extension 3+*  4- 4- 4-  Hip abduction 3+* 4- 4- 4-  Hip adduction 4 4 4+ 4+  Hip internal rotation      Hip external rotation      Knee flexion(tested in midrange sitting) 4+ 4+ 4+ 5  Knee extension 4- 4+ 4- 4+  Ankle dorsiflexion 5 5 5 5   Ankle plantarflexion      Ankle inversion      Ankle eversion       (Blank rows = not tested) * with pain in groin with MMT  FUNCTIONAL TESTS:  5 times sit to stand: 9.89 sec using hands to assist  06/12/23 5xSTS: 9 sec w/o hands  GAIT: Distance walked: 50 ft in clinic Assistive device utilized: None Level of assistance: Modified independence Comments: antalgic gait; furniture walks                                                                                                                                TREATMENT DATE: 06/12/23: Progress Note/Re-assess ROM MMT 5xSTS LEFS Review/Progress HEP  05/29/23: Bike x 5 minutes (seat 9) Mini squats with B UE support in // bars 2 x 15  Dynamic high marching with 2# AW in // bars x 5 laps Standing hip abduction with 2# AW 2 x 15 each LE with B UE support  Seated LAQ 2# AW 2 x 10 with 3 second  hold  6" step up 2 x 10 leading with each LE with single UE support 4" step down with  single rail x 10 each LE   05/22/23: Bike 5' seat 9 Minisquats front of chair 12x Heel raises 15x Toe raises 15x Step up 4 then 6in 10x  Sidestep with GTB around thigh 3RT Tandem stance on foam 3x 30" Vector stance 3x 5" with UE A Lateral step up 4in 10x with UE A    PATIENT EDUCATION:  Education details: Patient educated on exam findings, POC, scope of PT, HEP, and trial of kinesiotape. Person educated: Patient Education method: Explanation, Demonstration, and Handouts Education comprehension: verbalized understanding, returned demonstration, verbal cues required, and tactile cues required  HOME EXERCISE PROGRAM: Access Code: [email or web address] URL: [email or web address] Date: 06/12/2023 Prepared by: Fabiola Backer Powell-Butler  Exercises - Supine Quad Set  - 2 x daily - 7 x weekly - 1 sets - 10 reps - Active Straight Leg Raise with Quad Set  - 2 x daily - 7 x weekly - 1 sets - 10 reps - gentle 4 way patellar glides  - 2 x daily - 7 x weekly - 1 sets - 10 reps - Supine Bridge  - 2 x daily - 7 x weekly - 10 reps - Hooklying Hamstring Stretch with Strap  - 2 x daily - 7 x weekly - 3 reps - 30 sec hold - Supine Piriformis Stretch with Foot on Ground  - 2 x daily - 7 x weekly - 3 reps - 30 sec hold - Sidelying Hip Abduction  - 2 x daily - 7 x weekly - 2 sets - 10 reps - 3" hold - Side Stepping with Resistance at Thighs  - 1-2 x daily - 7 x weekly - 1 sets - 3 reps - Standing Hip Extension  - 2 x daily - 7 x weekly - 2 sets - 10 reps - Sit to Stand Without Arm Support  - 2 x daily - 7 x weekly - 2 sets - 10 reps - Lateral Step Up  - 2 x daily - 7 x weekly - 2 sets - 10 reps - Step Up  - 2 x daily - 7 x weekly - 2 sets - 10 reps    ASSESSMENT:  CLINICAL IMPRESSION:   Patient tolerated session well. ROM and strength improvements noted on this date via measurements, MMTs, and 5xSTS. Improvements with gait mechanics. Patient continues to demonstrate some R/L swaying but no reports of  pain or furniture walking while in clinic. Patient has met or partially met all personal and objective goals. Reports she feels comfortable maintaining progress at home while waiting on her next doctor appointment in May. HEP is reviewed and updated. Patient discharged from PT on this day.   Eval: Patient is a 73 y.o. female who was seen today for physical therapy evaluation and treatment for bilateral knee pain.Patient demonstrates muscle weakness, reduced ROM, and fascial restrictions which are likely contributing to symptoms of pain and are negatively impacting patient ability to perform ADLs and functional mobility tasks. Patient will benefit from skilled physical therapy services to address these deficits to reduce pain and improve level of function with ADLs and functional mobility tasks.   OBJECTIVE IMPAIRMENTS: Abnormal gait, decreased activity tolerance, decreased balance, decreased mobility, decreased ROM, decreased strength, increased fascial restrictions, impaired perceived functional ability, and pain.   ACTIVITY LIMITATIONS: carrying, lifting, bending, sitting, standing, squatting, sleeping, stairs, transfers, bathing, toileting, locomotion level, and caring for others  PARTICIPATION LIMITATIONS: meal prep, cleaning, laundry, shopping, community activity, occupation, and yard work  PERSONAL FACTORS: Profession are also affecting patient's functional outcome.   REHAB POTENTIAL: Good  CLINICAL DECISION MAKING: Evolving/moderate complexity  EVALUATION COMPLEXITY: Moderate   GOALS: Goals reviewed with patient? No  SHORT TERM GOALS: Target date: 05/22/2023 patient will be independent with initial HEP  Baseline: Goal status: MET  2.  Patient will report 30% improvement overall  Baseline:  Goal status: MET   LONG TERM GOALS: Target date: 06/12/2023  Patient will be independent in self management strategies to improve quality of life and functional outcomes.   Baseline:   Goal status: MET  2.  Patient will report 50% improvement overall  Baseline:  Goal status: MET  3.  Patient will improve LEFS score by 12  points or more to demonstrate improved perceived function  Baseline: 28/80 Goal status: MET  4.  Patient will ambulate x 225 ft in 2 minutes with minimal gait deviation Baseline:  Goal status: DID NOT ASSESS  5.  Patient will increase right leg MMT's to 5/5 to allow navigation of steps without gait deviation or loss of balance  Baseline: see above Goal status: PARTIALLY MET/ADEQUATE FOR DISCHARGE   PLAN:  PT FREQUENCY: 1x/week  PT DURATION: 6 weeks  PLANNED INTERVENTIONS: 97164- PT Re-evaluation, 97110-Therapeutic exercises, 97530- Therapeutic activity, 97112- Neuromuscular re-education, 97535- Self Care, 78295- Manual therapy, [phone]- Gait training, [phone]- Orthotic Fit/training, [phone]- Canalith repositioning, [id number]- Aquatic Therapy, [phone]- Splinting, Patient/Family education, Balance training, Stair training, Taping, Dry Needling, Joint mobilization, Joint manipulation, Spinal manipulation, Spinal mobilization, Scar mobilization, and DME instructions.   PLAN FOR NEXT SESSION: Discharge   10:39 AM, 06/12/23 Chryl Heck, PT, DPT [facility] [place]

## 2023-07-01 ENCOUNTER — Encounter (HOSPITAL_BASED_OUTPATIENT_CLINIC_OR_DEPARTMENT_OTHER): Payer: Self-pay | Admitting: Emergency Medicine

## 2023-07-01 ENCOUNTER — Emergency Department (HOSPITAL_BASED_OUTPATIENT_CLINIC_OR_DEPARTMENT_OTHER)

## 2023-07-01 ENCOUNTER — Other Ambulatory Visit: Payer: Self-pay

## 2023-07-01 ENCOUNTER — Emergency Department (HOSPITAL_BASED_OUTPATIENT_CLINIC_OR_DEPARTMENT_OTHER)
Admission: EM | Admit: 2023-07-01 | Discharge: 2023-07-01 | Disposition: A | Discharge status: Home / Self Care | Attending: Emergency Medicine | Admitting: Emergency Medicine

## 2023-07-01 DIAGNOSIS — N132 Hydronephrosis with renal and ureteral calculous obstruction: Secondary | ICD-10-CM | POA: Insufficient documentation

## 2023-07-01 DIAGNOSIS — K429 Umbilical hernia without obstruction or gangrene: Secondary | ICD-10-CM | POA: Diagnosis not present

## 2023-07-01 DIAGNOSIS — N23 Unspecified renal colic: Secondary | ICD-10-CM | POA: Diagnosis not present

## 2023-07-01 DIAGNOSIS — D72829 Elevated white blood cell count, unspecified: Secondary | ICD-10-CM | POA: Insufficient documentation

## 2023-07-01 DIAGNOSIS — K8020 Calculus of gallbladder without cholecystitis without obstruction: Secondary | ICD-10-CM | POA: Diagnosis not present

## 2023-07-01 DIAGNOSIS — R1032 Left lower quadrant pain: Secondary | ICD-10-CM | POA: Diagnosis present

## 2023-07-01 DIAGNOSIS — K5730 Diverticulosis of large intestine without perforation or abscess without bleeding: Secondary | ICD-10-CM | POA: Diagnosis not present

## 2023-07-01 LAB — COMPREHENSIVE METABOLIC PANEL WITH GFR
ALT: 20 U/L (ref 0–44)
AST: 22 U/L (ref 15–41)
Albumin: 4.6 g/dL (ref 3.5–5.0)
Alkaline Phosphatase: 59 U/L (ref 38–126)
Anion gap: 9 (ref 5–15)
BUN: 19 mg/dL (ref 8–23)
CO2: 24 mmol/L (ref 22–32)
Calcium: 9.5 mg/dL (ref 8.9–10.3)
Chloride: 104 mmol/L (ref 98–111)
Creatinine, Ser: 0.83 mg/dL (ref 0.44–1.00)
GFR, Estimated: >60 mL/min (ref >60)
Glucose, Bld: 127 mg/dL — ABNORMAL HIGH (ref 70–99)
Potassium: 4.8 mmol/L (ref 3.5–5.1)
Sodium: 137 mmol/L (ref 135–145)
Total Bilirubin: 0.6 mg/dL (ref 0.0–1.2)
Total Protein: 7.7 g/dL (ref 6.5–8.1)

## 2023-07-01 LAB — CBC
HCT: 45.3 % (ref 36.0–46.0)
Hemoglobin: 14.7 g/dL (ref 12.0–15.0)
MCH: 29.3 pg (ref 26.0–34.0)
MCHC: 32.5 g/dL (ref 30.0–36.0)
MCV: 90.4 fL (ref 80.0–100.0)
Platelets: 309 10*3/uL (ref 150–400)
RBC: 5.01 MIL/uL (ref 3.87–5.11)
RDW: 13.4 % (ref 11.5–15.5)
WBC: 12.2 10*3/uL — ABNORMAL HIGH (ref 4.0–10.5)
nRBC: 0 % (ref 0.0–0.2)

## 2023-07-01 LAB — URINALYSIS, ROUTINE W REFLEX MICROSCOPIC
Bacteria, UA: NONE SEEN
Bilirubin Urine: NEGATIVE
Glucose, UA: NEGATIVE mg/dL
Ketones, ur: NEGATIVE mg/dL
Leukocytes,Ua: NEGATIVE
Nitrite: NEGATIVE
Protein, ur: NEGATIVE mg/dL
RBC / HPF: >50 RBC/hpf (ref 0–5)
Specific Gravity, Urine: 1.015 (ref 1.005–1.030)
pH: 5 (ref 5.0–8.0)

## 2023-07-01 LAB — LIPASE, BLOOD: Lipase: 58 U/L — ABNORMAL HIGH (ref 11–51)

## 2023-07-01 MED ORDER — OXYCODONE HCL 5 MG PO TABS: 5.0000 mg | ORAL_TABLET | Freq: Four times a day (QID) | ORAL | 0 refills | Status: AC | PRN

## 2023-07-01 MED ORDER — TAMSULOSIN HCL 0.4 MG PO CAPS: 0.4000 mg | ORAL_CAPSULE | Freq: Every day | ORAL | 0 refills | Status: DC

## 2023-07-01 MED ORDER — ONDANSETRON 4 MG PO TBDP: 4.0000 mg | ORAL_TABLET | Freq: Three times a day (TID) | ORAL | 0 refills | Status: AC | PRN

## 2023-07-01 NOTE — ED Notes (Signed)
 Pt aware of the need for a urine... Unable to currently provide the sample.Marland KitchenMarland Kitchen

## 2023-07-01 NOTE — Discharge Instructions (Addendum)
 Please read and follow all provided instructions.  Your diagnoses today include:  1. Ureteral colic     Tests performed today include: Urine test that showed blood in your urine and no infection CT scan which showed a 6 millimeter kidney stone on the left side, also showed some arthritis in the spine, aortic atherosclerosis, mild sigmoid diverticulosis Blood test that showed normal kidney function Vital signs. See below for your results today.   Medications prescribed:  Oxycodone - narcotic pain medication  DO NOT drive or perform any activities that require you to be awake and alert because this medicine can make you drowsy.   Zofran (ondansetron) - for nausea and vomiting  Flomax (tamsulosin) - relaxes smooth muscle to help kidney stones pass  Take any prescribed medications only as directed.  Home care instructions:  Follow any educational materials contained in this packet.  Please double your fluid intake for the next several days. Strain your urine and save any stones that may pass.   BE VERY CAREFUL not to take multiple medicines containing Tylenol (also called acetaminophen). Doing so can lead to an overdose which can damage your liver and cause liver failure and possibly death.   Follow-up instructions: Please follow-up with your urologist or the urologist referral (provided on front page) in the next 1 week for further evaluation of your symptoms.  Return instructions:  If you need to return to the Emergency Department, go to [facility] and not [facility]. The urologists are located at [facility] and can better care for you at this location.  Please return to the Emergency Department if you experience worsening symptoms.  Please return if you develop fever or uncontrolled pain or vomiting. Please return if you have any other emergent concerns.  Additional Information:  Your vital signs today were: BP (!) 185/83 (BP Location: Left Arm)   Pulse  78   Temp 98 F (36.7 C) (Oral)   Resp 16   SpO2 98%  If your blood pressure (BP) was elevated above 135/85 this visit, please have this repeated by your doctor within one month. --------------

## 2023-07-01 NOTE — ED Triage Notes (Signed)
 Left side flank pain Started this AM  Nausea.

## 2023-07-01 NOTE — ED Provider Notes (Signed)
 [place] EMERGENCY DEPARTMENT AT [facility] Provider Note   CSN: [id number] Arrival date & time: 07/01/23  1102     History  Chief Complaint  Patient presents with   Abdominal Pain    Natalie Cowan is a 73 y.o. female.  Patient is with history of cesarean section no other abdominal surgeries presents to the emergency department for evaluation of left flank and lower abdominal pain.  Symptoms began this morning patient has had 3 episodes of severe pain with resolution, severe nausea without vomiting.  Pain is in the left flank and radiates to the left lower abdomen.  She denies irritative urinary tract symptoms or hematuria.  No chest pain or shortness of breath.  Patient has a known history of gallstones.  She has never had a kidney stone but did have an incidental renal stone on the left side on a CT scan that was performed in January 2025.  No fevers.  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  Currently the pain has resolved.  She is unable to take NSAIDs due to liver dysfunction.       Home Medications Prior to Admission medications   Medication Sig Start Date End Date Taking? Authorizing Provider  acetaminophen (TYLENOL ARTHRITIS PAIN) 650 MG CR tablet Take 650 mg by mouth every 8 (eight) hours as needed for pain.     [provider]      Allergies    Sulfur    Review of Systems   Review of Systems  Physical Exam Updated Vital Signs BP (!) 193/81 (BP Location: Right Arm)   Pulse 77   Temp 98 F (36.7 C)   Resp 18   SpO2 93%   Physical Exam Vitals and nursing note reviewed.  Constitutional:      General: She is not in acute distress.    Appearance: She is well-developed.  HENT:     Head: Normocephalic and atraumatic.     Right Ear: External ear normal.     Left Ear: Tympanic membrane, ear canal and external ear normal.     Ears:     Comments: Checked left ear due to patient recently hearing  "heartbeat" in this ear.  Normal TM.    Nose: Nose normal.  Eyes:     Conjunctiva/sclera: Conjunctivae normal.  Cardiovascular:     Rate and Rhythm: Normal rate and regular rhythm.     Heart sounds: No murmur heard. Pulmonary:     Effort: No respiratory distress.     Breath sounds: No wheezing, rhonchi or rales.  Abdominal:     Palpations: Abdomen is soft.     Tenderness: There is no abdominal tenderness. There is no guarding or rebound. Negative signs include Murphy's sign and McBurney's sign.  Musculoskeletal:     Cervical back: Normal range of motion and neck supple.     Right lower leg: No edema.     Left lower leg: No edema.  Skin:    General: Skin is warm and dry.     Findings: No rash.  Neurological:     General: No focal deficit present.     Mental Status: She is alert. Mental status is at baseline.     Motor: No weakness.  Psychiatric:        Mood and Affect: Mood normal.     ED Results / Procedures / Treatments   Labs (all labs ordered are listed, but only abnormal  results are displayed) Labs Reviewed  LIPASE, BLOOD - Abnormal; Notable for the following components:      Result Value   Lipase 58 (*)    All other components within normal limits  COMPREHENSIVE METABOLIC PANEL WITH GFR - Abnormal; Notable for the following components:   Glucose, Bld 127 (*)    All other components within normal limits  CBC - Abnormal; Notable for the following components:   WBC 12.2 (*)    All other components within normal limits  URINALYSIS, ROUTINE W REFLEX MICROSCOPIC - Abnormal; Notable for the following components:   Hgb urine dipstick LARGE (*)    All other components within normal limits    EKG None  Radiology CT Renal Stone Study Result Date: 07/01/2023 CLINICAL DATA:  Left flank pain starting this morning.  Nausea. EXAM: CT ABDOMEN AND PELVIS WITHOUT CONTRAST TECHNIQUE: Multidetector CT imaging of the abdomen and pelvis was performed following the standard protocol  without IV contrast. RADIATION DOSE REDUCTION: This exam was performed according to the departmental dose-optimization program which includes automated exposure control, adjustment of the mA and/or kV according to patient size and/or use of iterative reconstruction technique. COMPARISON:  03/20/2023 FINDINGS: Lower chest: Unremarkable Hepatobiliary: Multiple gallstones measuring up to 1.4 cm in diameter scattered in the contracted gallbladder. Common hepatic duct 1.2 cm in diameter on image 20 series 2, formerly same. Common bile duct caliber estimated at 0.9 cm in diameter on image 60 series 4, previously the same. No definite calcification observed in the extrahepatic biliary tree. Pancreas: Unremarkable Spleen: Unremarkable Adrenals/Urinary Tract: Mild left hydronephrosis associated with a 0.6 cm left ureteropelvic junction stone shown on image 32 series 2. This stone was previously in the left kidney lower pole collecting system on 03/20/2023. The left ureter distal to the stone appears normal. Adrenal glands in urinary bladder normal. No other urinary tract calculi observed. Stomach/Bowel: Mild sigmoid colon diverticulosis.  Normal appendix. Vascular/Lymphatic: Atherosclerosis is present, including aortoiliac atherosclerotic disease. Reproductive: Unremarkable Other: No supplemental non-categorized findings. Musculoskeletal: Grade 1 degenerative anterolisthesis at L4-5. Grade 1 anterolisthesis at L5-S1 is attributable to bilateral chronic L5 pars defects. Lumbar spondylosis and degenerative disc disease resulting in multilevel impingement. Small (2.0 cm in diameter) umbilical hernia contains adipose tissue. IMPRESSION: 1. New mild left hydronephrosis associated with a 0.6 cm left ureteropelvic junction stone. This stone was previously in the left kidney lower pole collecting system on 03/20/2023. 2. Cholelithiasis. 3. Mild sigmoid colon diverticulosis. 4. Lumbar spondylosis and degenerative disc disease  resulting in multilevel impingement. 5. Chronic bilateral pars defects at L5. 6. Small umbilical hernia contains adipose tissue. 7.  Aortic Atherosclerosis (ICD10-I70.0). Electronically Signed   By: Freida Jes M.D.   On: 07/01/2023 15:11    Procedures Procedures    Medications Ordered in ED Medications - No data to display  ED Course/ Medical Decision Making/ A&P    Patient seen and examined. History obtained directly from patient. Work-up including labs, imaging, EKG ordered in triage, if performed, were reviewed.    Labs/EKG: Independently reviewed and interpreted.  This included: CBC with mildly elevated white blood cell count at 12.2 otherwise normal hemoglobin and unremarkable; CMP with glucose 127 otherwise unremarkable; lipase minimally elevated at 58.  UA pending.  Imaging: Ordered renal protocol CT.  Medications/Fluids: None ordered, patient currently asymptomatic  Most recent vital signs reviewed and are as follows: BP (!) 193/81 (BP Location: Right Arm)   Pulse 77   Temp 98 F (36.7 C)   Resp  18   SpO2 93%   Initial impression: Left-sided colicky flank and lower abdominal pain, concern for renal colic.  3:26 PM Reassessment performed. Patient appears stable and comfortable during multiple rechecks.  Patient has not had recurrent pain.  Labs personally reviewed and interpreted including: UA with blood, no infection  Imaging personally visualized and interpreted including: CT scan, agree proximal 6 mm stone on the left side, diverticulosis without diverticulitis.  Gallstones unchanged.  Reviewed pertinent lab work and imaging with patient at bedside. Questions answered.   Most current vital signs reviewed and are as follows: BP (!) 185/83 (BP Location: Left Arm)   Pulse 78   Temp 98 F (36.7 C) (Oral)   Resp 16   SpO2 98%   Plan: Discharge to home.   Prescriptions written for: Oxycodone, Zofran, Flomax  3:27 PM Patient counseled on kidney stone  treatment. Urged patient to strain urine and save any stones. Urged urology follow-up and return to [facility] with any complications. Counseled patient to maintain good fluid intake.   Counseled patient on use of Flomax.   Patient counseled on use of narcotic pain medications. Counseled not to combine these medications with others containing tylenol. Urged not to drink alcohol, drive, or perform any other activities that requires focus while taking these medications. The patient verbalizes understanding and agrees with the plan.                                 Medical Decision Making Amount and/or Complexity of Data Reviewed Labs: ordered. Radiology: ordered.  Risk Prescription drug management.   For this patient's complaint of abdominal pain, the following conditions were considered on the differential diagnosis: gastritis/PUD, enteritis/duodenitis, appendicitis, cholelithiasis/cholecystitis, cholangitis, pancreatitis, ruptured viscus, colitis, diverticulitis, small/large bowel obstruction, proctitis, cystitis, pyelonephritis, ureteral colic, aortic dissection, aortic aneurysm. In women, pelvic inflammatory disease, ovarian cysts, and tubo-ovarian abscess were also considered. Atypical chest etiologies were also considered including ACS, PE, and pneumonia.  Symptoms typical for ureteral colic and this is confirmed on CT scan.  No other acute findings to explain symptoms.  No concern for pyelonephritis.  The patient's vital signs, pertinent lab work and imaging were reviewed and interpreted as discussed in the ED course. Hospitalization was considered for further testing, treatments, or serial exams/observation. However as patient is well-appearing, has a stable exam, and reassuring studies today, I do not feel that they warrant admission at this time. This plan was discussed with the patient who verbalizes agreement and comfort with this plan and seems reliable and able to return to the Emergency  Department with worsening or changing symptoms.            Final Clinical Impression(s) / ED Diagnoses Final diagnoses:  Ureteral colic    Rx / DC Orders ED Discharge Orders          Ordered    oxyCODONE (OXY IR/ROXICODONE) 5 MG immediate release tablet  Every 6 hours PRN        07/01/23 1524    ondansetron (ZOFRAN-ODT) 4 MG disintegrating tablet  Every 8 hours PRN        07/01/23 1524    tamsulosin (FLOMAX) 0.4 MG CAPS capsule  Daily        07/01/23 1524              Renne Crigler, PA-C 07/01/23 1529    Ernie Avena, MD 07/01/23 1536

## 2023-07-10 ENCOUNTER — Other Ambulatory Visit: Payer: Self-pay | Admitting: Urology

## 2023-07-10 DIAGNOSIS — R1084 Generalized abdominal pain: Secondary | ICD-10-CM | POA: Diagnosis not present

## 2023-07-10 DIAGNOSIS — N201 Calculus of ureter: Secondary | ICD-10-CM | POA: Diagnosis not present

## 2023-07-17 DIAGNOSIS — M25562 Pain in left knee: Secondary | ICD-10-CM | POA: Diagnosis not present

## 2023-07-17 DIAGNOSIS — M1712 Unilateral primary osteoarthritis, left knee: Secondary | ICD-10-CM | POA: Diagnosis not present

## 2023-07-17 DIAGNOSIS — I10 Essential (primary) hypertension: Secondary | ICD-10-CM | POA: Diagnosis not present

## 2023-07-17 DIAGNOSIS — M170 Bilateral primary osteoarthritis of knee: Secondary | ICD-10-CM | POA: Diagnosis not present

## 2023-07-17 DIAGNOSIS — G8929 Other chronic pain: Secondary | ICD-10-CM | POA: Diagnosis not present

## 2023-07-17 DIAGNOSIS — M1711 Unilateral primary osteoarthritis, right knee: Secondary | ICD-10-CM | POA: Diagnosis not present

## 2023-07-17 DIAGNOSIS — M25561 Pain in right knee: Secondary | ICD-10-CM | POA: Diagnosis not present

## 2023-07-18 ENCOUNTER — Encounter (HOSPITAL_COMMUNITY): Payer: Self-pay | Admitting: Urology

## 2023-07-18 NOTE — Progress Notes (Signed)
 Left voicemial stating arrival time 0645 at main entrance of Natalie Cowan, bring insurance card and driver's license, take laxative of choice on Sunday afternoon, eat a light meal that evening and hydrate well, bring blue folder, don't bring money , valuables or credit cards, don't wear sandals, flip flops or crocs and no metal from waist down, do not take any aspirin, or nonsteroidal anti inflammatories, vitamins or supplements after today. Left call back number [phone]

## 2023-07-18 NOTE — Progress Notes (Signed)
 Spoke w/ via phone for pre-op interview---Natalie Cowan needs dos---- KUB              COVID test -----patient states asymptomatic no test needed Arrive at ------0645 NPO after MN NO Solid Food.  Clear liquids from MN until--- Med rec completed. Pt aware to hold ASA/NSAIDs and supplements per [facility] protocol.  Medications to take morning of surgery -----oxycodone , tylenol , zofran  if needed by 4 AM Diabetic/Weight loss medication ----- No Alcohol or recreational drugs for 24 hours/Tobacco products for 6 hours ---- Patient instructed to bring blue lithotripsy folder, photo id and insurance card day of surgery. Patient aware to have Driver (ride ) / caregiver for 24 hours after surgery -----Natalie Cowan Patient Special Instructions -----bring blue folder and wear comfortable clothes Pre-Op special Instructions ----- take laxative of choice day before procedure Patient verbalized understanding of instructions that were given at this phone interview. Patient denies shortness of breath, chest pain, fever, cough at this phone interview.

## 2023-07-21 ENCOUNTER — Encounter (HOSPITAL_COMMUNITY): Payer: Self-pay | Admitting: Urology

## 2023-07-21 ENCOUNTER — Ambulatory Visit (HOSPITAL_COMMUNITY)
Admission: RE | Admit: 2023-07-21 | Discharge: 2023-07-21 | Disposition: A | Discharge status: Home / Self Care | Attending: Urology | Admitting: Urology

## 2023-07-21 ENCOUNTER — Encounter (HOSPITAL_COMMUNITY)
Admission: RE | Disposition: A | Discharge status: Home / Self Care | Payer: Self-pay | Source: Home / Self Care | Attending: Urology

## 2023-07-21 ENCOUNTER — Other Ambulatory Visit: Payer: Self-pay

## 2023-07-21 ENCOUNTER — Ambulatory Visit (HOSPITAL_COMMUNITY)

## 2023-07-21 DIAGNOSIS — Z87891 Personal history of nicotine dependence: Secondary | ICD-10-CM | POA: Diagnosis not present

## 2023-07-21 DIAGNOSIS — N201 Calculus of ureter: Secondary | ICD-10-CM | POA: Diagnosis not present

## 2023-07-21 HISTORY — DX: Calculus of kidney: N20.0

## 2023-07-21 HISTORY — PX: EXTRACORPOREAL SHOCK WAVE LITHOTRIPSY: SHX1557

## 2023-07-21 LAB — GLUCOSE, CAPILLARY: Glucose-Capillary: 114 mg/dL — ABNORMAL HIGH (ref 70–99)

## 2023-07-21 SURGERY — LITHOTRIPSY, ESWL
Anesthesia: LOCAL | Laterality: Left

## 2023-07-21 MED ORDER — DIAZEPAM 5 MG PO TABS: 10.0000 mg | ORAL_TABLET | ORAL | Status: DC

## 2023-07-21 MED ORDER — CIPROFLOXACIN HCL 500 MG PO TABS
ORAL_TABLET | ORAL | Status: AC
  Filled 2023-07-21: qty 1

## 2023-07-21 MED ORDER — TAMSULOSIN HCL 0.4 MG PO CAPS: 0.4000 mg | ORAL_CAPSULE | Freq: Every day | ORAL | 1 refills | Status: AC

## 2023-07-21 MED ORDER — DIPHENHYDRAMINE HCL 25 MG PO CAPS
25.0000 mg | ORAL_CAPSULE | ORAL | Status: AC
  Administered 2023-07-21: 25 mg via ORAL

## 2023-07-21 MED ORDER — SODIUM CHLORIDE 0.9 % IV SOLN
INTRAVENOUS | Status: DC
  Administered 2023-07-21: 1000 mL via INTRAVENOUS

## 2023-07-21 MED ORDER — DIAZEPAM 5 MG PO TABS
ORAL_TABLET | ORAL | Status: AC
  Filled 2023-07-21: qty 1

## 2023-07-21 MED ORDER — DIAZEPAM 5 MG PO TABS
5.0000 mg | ORAL_TABLET | ORAL | Status: AC
  Administered 2023-07-21: 5 mg via ORAL

## 2023-07-21 MED ORDER — DIPHENHYDRAMINE HCL 25 MG PO CAPS
ORAL_CAPSULE | ORAL | Status: AC
  Filled 2023-07-21: qty 1

## 2023-07-21 MED ORDER — CIPROFLOXACIN HCL 500 MG PO TABS
500.0000 mg | ORAL_TABLET | ORAL | Status: AC
  Administered 2023-07-21: 500 mg via ORAL

## 2023-07-21 NOTE — H&P (Signed)
 CC/HPI: Natalie Cowan is a 73 year old female who is seen in consultation today for urolithiasis.   1. Urolithiasis:  She presented to the ED on 07/01/2023 with left-sided flank pain.  CT A/P 07/01/2023 with 6 mm left UPJ stone with new mild left hydronephrosis. This is previous in the left lower pole in 03/2023.  KUB 07/10/2023 with persistent 6 mm stone in left proximal ureter around level of L2.  She denies recent abdominal pain, flank pain, fevers, chills, dysuria.   She is a International aid/development worker.     ALLERGIES: Sulfa    MEDICATIONS: metFORMIN HCl 500 MG Tablet  CoQ10  Glucosamine Chondroitin Plus  Magnesium 400 MG Tablet  Red Yeast Rice  Tylenol   Vitamin D (Ergocalciferol)     GU PSH: None   NON-GU PSH: C-Section     GU PMH: None   NON-GU PMH: Acute gastric ulcer with hemorrhage Arthritis Hypercholesterolemia Hypertension    FAMILY HISTORY: No Family History    SOCIAL HISTORY: Marital Status: Married Current Smoking Status: Patient does not smoke anymore. Has not smoked since 06/16/1985. Smoked 1 pack per day.   Tobacco Use Assessment Completed: Used Tobacco in last 30 days? Has never drank.  Drinks 2 caffeinated drinks per day.    REVIEW OF SYSTEMS:    GU Review Female:   Patient reports leakage of urine. Patient denies frequent urination, hard to postpone urination, burning /pain with urination, get up at night to urinate, stream starts and stops, trouble starting your stream, have to strain to urinate, and being pregnant.  Gastrointestinal (Upper):   Patient reports nausea. Patient denies vomiting and indigestion/ heartburn.  Gastrointestinal (Lower):   Patient reports diarrhea. Patient denies constipation.  Constitutional:   Patient denies fever, night sweats, weight loss, and fatigue.  Skin:   Patient denies skin rash/ lesion and itching.  Eyes:   Patient denies blurred vision and double vision.  Ears/ Nose/ Throat:   Patient denies sore throat and sinus problems.   Hematologic/Lymphatic:   Patient denies swollen glands and easy bruising.  Cardiovascular:   Patient denies leg swelling and chest pains.  Respiratory:   Patient denies cough and shortness of breath.  Endocrine:   Patient denies excessive thirst.  Musculoskeletal:   Patient reports joint pain. Patient denies back pain.  Neurological:   Patient denies dizziness and headaches.  Psychologic:   Patient denies depression and anxiety.   VITAL SIGNS:      07/10/2023 10:34 AM  Weight 210 lb / 95.25 kg  Height 63 in / 160.02 cm  BP 145/63 mmHg  Pulse 71 /min  Temperature 98.6 F / 37 C  BMI 37.2 kg/m   MULTI-SYSTEM PHYSICAL EXAMINATION:    Constitutional: Well-nourished. No physical deformities. Normally developed. Good grooming.  Respiratory: No labored breathing, no use of accessory muscles.   Cardiovascular: Normal temperature, normal extremity pulses, no swelling, no varicosities.  Gastrointestinal: No mass, no tenderness, no rigidity, non obese abdomen.     Complexity of Data:  Source Of History:  Patient, Medical Record Summary  Records Review:   Previous Doctor Records, Previous Patient Records  Urine Test Review:   Urinalysis  X-Ray Review: KUB: Reviewed Films. Reviewed Report. Discussed With Patient.  C.T. Abdomen/Pelvis: Reviewed Films. Reviewed Report. Discussed With Patient.     PROCEDURES:         KUB - [id number]  A single view of the abdomen is obtained. 6 mm calcification in the left proximal ureter at the level of L2. Left pelvic  phleboliths are present.      Patient confirmed No Neulasta OnPro Device.           Visit Complexity - G2211          Urinalysis - 81003 Dipstick Dipstick Cont'd  Color: Yellow Bilirubin: Neg  Appearance: Clear Ketones: Neg  Specific Gravity: 1.015 Blood: Neg  pH: <=5.0 Protein: Neg  Glucose: Neg Urobilinogen: 0.2    Nitrites: Neg    Leukocyte Esterase: Neg    Notes:      ASSESSMENT:      ICD-10 Details  1 GU:   Ureteral  calculus - N20.1   2   Flank Pain - R10.84    PLAN:           Orders Labs Urine Culture  X-Rays: KUB. No Oral Contrast          Schedule         Document Letter(s):  Created for Patient: Clinical Summary         Notes:    #1. Urolithiasis:  - CT A/P 4/25 with 6 mm left UPJ stone with mild left hydronephrosis. No other renal stones or calcifications present. Faint calcification present on scout imaging at a lower level of L2.  - Standing confirmed today on KUB at L2.  - We discussed options including ESWL and ureteroscopy with laser lithotripsy. She elects to proceed with ESWL. Discussed risk and benefits as below. Surgery letter submitted.   We discussed the options for management of kidney stones, including observation, ESWL, ureteroscopy with laser lithotripsy, and PCNL. The risks and benefits of each option were discussed.  For observation I described the risks which include but are not limited to silent renal damage, life-threatening infection, need for emergent surgery, failure to pass stone, and pain.   ESWL: risks and benefits of ESWL were outlined including infection, bleeding, pain, steinstrasse, kidney injury, need for ancillary treatments, and global anesthesia risks including but not limited to CVA, MI, DVT, PE, pneumonia, and death.   Ureteroscopy: risks and benefits of ureteroscopy were outlined, including infection, bleeding, pain, temporary ureteral stent and associated stent bother, ureteral injury, ureteral stricture, need for ancillary treatments, and global anesthesia risks including but not limited to CVA, MI, DVT, PE, pneumonia, and death.          Next Appointment:      Next Appointment: 07/21/2023 08:45 AM    Appointment Type: Surgery     Location: [facility] [phone]    Provider: Leila Punt, III, M.D.    Reason for Visit: (L) ESWL      Signed by Doy Gene, M.D. on 07/10/23 at 6:13 PM (EDT

## 2023-07-21 NOTE — Op Note (Signed)
 See [employer] OP note scanned into chart. Also because of the size, density, location and other factors that cannot be anticipated I feel this will likely be a staged procedure. This fact supersedes any indication in the scanned [place] stone operative note to the contrary.

## 2023-07-22 ENCOUNTER — Encounter (HOSPITAL_COMMUNITY): Payer: Self-pay | Admitting: Urology

## 2023-07-24 ENCOUNTER — Other Ambulatory Visit (HOSPITAL_COMMUNITY): Payer: Self-pay | Admitting: Family Medicine

## 2023-07-24 DIAGNOSIS — R0609 Other forms of dyspnea: Secondary | ICD-10-CM | POA: Diagnosis not present

## 2023-07-24 DIAGNOSIS — E669 Obesity, unspecified: Secondary | ICD-10-CM | POA: Diagnosis not present

## 2023-07-24 DIAGNOSIS — I10 Essential (primary) hypertension: Secondary | ICD-10-CM | POA: Diagnosis not present

## 2023-07-24 DIAGNOSIS — M25561 Pain in right knee: Secondary | ICD-10-CM | POA: Diagnosis not present

## 2023-07-24 DIAGNOSIS — M25562 Pain in left knee: Secondary | ICD-10-CM | POA: Diagnosis not present

## 2023-07-24 DIAGNOSIS — Z1231 Encounter for screening mammogram for malignant neoplasm of breast: Secondary | ICD-10-CM

## 2023-07-24 DIAGNOSIS — R7303 Prediabetes: Secondary | ICD-10-CM | POA: Diagnosis not present

## 2023-07-24 DIAGNOSIS — I7300 Raynaud's syndrome without gangrene: Secondary | ICD-10-CM | POA: Diagnosis not present

## 2023-07-24 DIAGNOSIS — D72829 Elevated white blood cell count, unspecified: Secondary | ICD-10-CM | POA: Diagnosis not present

## 2023-07-24 DIAGNOSIS — E875 Hyperkalemia: Secondary | ICD-10-CM | POA: Diagnosis not present

## 2023-07-24 DIAGNOSIS — L439 Lichen planus, unspecified: Secondary | ICD-10-CM | POA: Diagnosis not present

## 2023-07-24 DIAGNOSIS — E782 Mixed hyperlipidemia: Secondary | ICD-10-CM | POA: Diagnosis not present

## 2023-07-24 DIAGNOSIS — R600 Localized edema: Secondary | ICD-10-CM | POA: Diagnosis not present

## 2023-08-07 ENCOUNTER — Encounter (HOSPITAL_COMMUNITY): Payer: Self-pay

## 2023-08-07 ENCOUNTER — Ambulatory Visit (HOSPITAL_COMMUNITY)
Admission: RE | Admit: 2023-08-07 | Discharge: 2023-08-07 | Disposition: A | Discharge status: Home / Self Care | Source: Ambulatory Visit | Attending: Family Medicine | Admitting: Family Medicine

## 2023-08-07 DIAGNOSIS — N201 Calculus of ureter: Secondary | ICD-10-CM | POA: Diagnosis not present

## 2023-08-07 DIAGNOSIS — Z1231 Encounter for screening mammogram for malignant neoplasm of breast: Secondary | ICD-10-CM | POA: Diagnosis not present

## 2023-09-11 DIAGNOSIS — Z87891 Personal history of nicotine dependence: Secondary | ICD-10-CM | POA: Diagnosis not present

## 2023-09-11 DIAGNOSIS — I10 Essential (primary) hypertension: Secondary | ICD-10-CM | POA: Diagnosis not present

## 2023-09-11 DIAGNOSIS — E559 Vitamin D deficiency, unspecified: Secondary | ICD-10-CM | POA: Diagnosis not present

## 2023-09-11 DIAGNOSIS — R7301 Impaired fasting glucose: Secondary | ICD-10-CM | POA: Diagnosis not present

## 2023-09-18 DIAGNOSIS — M1711 Unilateral primary osteoarthritis, right knee: Secondary | ICD-10-CM | POA: Diagnosis not present

## 2023-10-09 DIAGNOSIS — Z01810 Encounter for preprocedural cardiovascular examination: Secondary | ICD-10-CM | POA: Diagnosis not present

## 2023-10-09 DIAGNOSIS — Z01818 Encounter for other preprocedural examination: Secondary | ICD-10-CM | POA: Diagnosis not present

## 2023-10-23 DIAGNOSIS — M25561 Pain in right knee: Secondary | ICD-10-CM | POA: Diagnosis not present

## 2023-10-23 DIAGNOSIS — M1711 Unilateral primary osteoarthritis, right knee: Secondary | ICD-10-CM | POA: Diagnosis not present

## 2023-10-23 DIAGNOSIS — M25661 Stiffness of right knee, not elsewhere classified: Secondary | ICD-10-CM | POA: Diagnosis not present

## 2023-10-23 DIAGNOSIS — G8929 Other chronic pain: Secondary | ICD-10-CM | POA: Diagnosis not present

## 2023-11-25 DIAGNOSIS — M25761 Osteophyte, right knee: Secondary | ICD-10-CM | POA: Diagnosis not present

## 2023-11-25 DIAGNOSIS — M1711 Unilateral primary osteoarthritis, right knee: Secondary | ICD-10-CM | POA: Diagnosis not present

## 2023-11-25 DIAGNOSIS — G8918 Other acute postprocedural pain: Secondary | ICD-10-CM | POA: Diagnosis not present

## 2023-11-27 DIAGNOSIS — M25661 Stiffness of right knee, not elsewhere classified: Secondary | ICD-10-CM | POA: Diagnosis not present

## 2023-11-27 DIAGNOSIS — M25561 Pain in right knee: Secondary | ICD-10-CM | POA: Diagnosis not present

## 2023-11-27 DIAGNOSIS — M6281 Muscle weakness (generalized): Secondary | ICD-10-CM | POA: Diagnosis not present

## 2023-12-01 DIAGNOSIS — M6281 Muscle weakness (generalized): Secondary | ICD-10-CM | POA: Diagnosis not present

## 2023-12-01 DIAGNOSIS — M25561 Pain in right knee: Secondary | ICD-10-CM | POA: Diagnosis not present

## 2023-12-01 DIAGNOSIS — M25661 Stiffness of right knee, not elsewhere classified: Secondary | ICD-10-CM | POA: Diagnosis not present

## 2023-12-03 DIAGNOSIS — M25561 Pain in right knee: Secondary | ICD-10-CM | POA: Diagnosis not present

## 2023-12-03 DIAGNOSIS — M25661 Stiffness of right knee, not elsewhere classified: Secondary | ICD-10-CM | POA: Diagnosis not present

## 2023-12-03 DIAGNOSIS — M6281 Muscle weakness (generalized): Secondary | ICD-10-CM | POA: Diagnosis not present

## 2023-12-05 DIAGNOSIS — M25661 Stiffness of right knee, not elsewhere classified: Secondary | ICD-10-CM | POA: Diagnosis not present

## 2023-12-05 DIAGNOSIS — M25561 Pain in right knee: Secondary | ICD-10-CM | POA: Diagnosis not present

## 2023-12-05 DIAGNOSIS — M6281 Muscle weakness (generalized): Secondary | ICD-10-CM | POA: Diagnosis not present

## 2023-12-08 DIAGNOSIS — M25561 Pain in right knee: Secondary | ICD-10-CM | POA: Diagnosis not present

## 2023-12-08 DIAGNOSIS — M25661 Stiffness of right knee, not elsewhere classified: Secondary | ICD-10-CM | POA: Diagnosis not present

## 2023-12-08 DIAGNOSIS — M6281 Muscle weakness (generalized): Secondary | ICD-10-CM | POA: Diagnosis not present

## 2023-12-10 DIAGNOSIS — M25561 Pain in right knee: Secondary | ICD-10-CM | POA: Diagnosis not present

## 2023-12-10 DIAGNOSIS — M6281 Muscle weakness (generalized): Secondary | ICD-10-CM | POA: Diagnosis not present

## 2023-12-10 DIAGNOSIS — M25661 Stiffness of right knee, not elsewhere classified: Secondary | ICD-10-CM | POA: Diagnosis not present

## 2023-12-12 DIAGNOSIS — M6281 Muscle weakness (generalized): Secondary | ICD-10-CM | POA: Diagnosis not present

## 2023-12-12 DIAGNOSIS — M25661 Stiffness of right knee, not elsewhere classified: Secondary | ICD-10-CM | POA: Diagnosis not present

## 2023-12-12 DIAGNOSIS — M25561 Pain in right knee: Secondary | ICD-10-CM | POA: Diagnosis not present

## 2023-12-15 DIAGNOSIS — M6281 Muscle weakness (generalized): Secondary | ICD-10-CM | POA: Diagnosis not present

## 2023-12-15 DIAGNOSIS — M25561 Pain in right knee: Secondary | ICD-10-CM | POA: Diagnosis not present

## 2023-12-15 DIAGNOSIS — M25661 Stiffness of right knee, not elsewhere classified: Secondary | ICD-10-CM | POA: Diagnosis not present

## 2023-12-16 DIAGNOSIS — R5381 Other malaise: Secondary | ICD-10-CM | POA: Diagnosis not present

## 2023-12-16 DIAGNOSIS — R5383 Other fatigue: Secondary | ICD-10-CM | POA: Diagnosis not present

## 2023-12-16 DIAGNOSIS — I10 Essential (primary) hypertension: Secondary | ICD-10-CM | POA: Diagnosis not present

## 2023-12-17 DIAGNOSIS — M25561 Pain in right knee: Secondary | ICD-10-CM | POA: Diagnosis not present

## 2023-12-17 DIAGNOSIS — M25661 Stiffness of right knee, not elsewhere classified: Secondary | ICD-10-CM | POA: Diagnosis not present

## 2023-12-17 DIAGNOSIS — M6281 Muscle weakness (generalized): Secondary | ICD-10-CM | POA: Diagnosis not present

## 2023-12-22 DIAGNOSIS — M25661 Stiffness of right knee, not elsewhere classified: Secondary | ICD-10-CM | POA: Diagnosis not present

## 2023-12-22 DIAGNOSIS — M6281 Muscle weakness (generalized): Secondary | ICD-10-CM | POA: Diagnosis not present

## 2023-12-22 DIAGNOSIS — M25561 Pain in right knee: Secondary | ICD-10-CM | POA: Diagnosis not present

## 2023-12-25 DIAGNOSIS — M6281 Muscle weakness (generalized): Secondary | ICD-10-CM | POA: Diagnosis not present

## 2023-12-25 DIAGNOSIS — M25561 Pain in right knee: Secondary | ICD-10-CM | POA: Diagnosis not present

## 2023-12-25 DIAGNOSIS — M25661 Stiffness of right knee, not elsewhere classified: Secondary | ICD-10-CM | POA: Diagnosis not present

## 2023-12-30 DIAGNOSIS — M6281 Muscle weakness (generalized): Secondary | ICD-10-CM | POA: Diagnosis not present

## 2023-12-30 DIAGNOSIS — M25661 Stiffness of right knee, not elsewhere classified: Secondary | ICD-10-CM | POA: Diagnosis not present

## 2023-12-30 DIAGNOSIS — M25561 Pain in right knee: Secondary | ICD-10-CM | POA: Diagnosis not present

## 2024-01-01 DIAGNOSIS — M25562 Pain in left knee: Secondary | ICD-10-CM | POA: Diagnosis not present

## 2024-01-01 DIAGNOSIS — Z5189 Encounter for other specified aftercare: Secondary | ICD-10-CM | POA: Diagnosis not present

## 2024-01-08 DIAGNOSIS — M25561 Pain in right knee: Secondary | ICD-10-CM | POA: Diagnosis not present

## 2024-01-08 DIAGNOSIS — M25661 Stiffness of right knee, not elsewhere classified: Secondary | ICD-10-CM | POA: Diagnosis not present

## 2024-01-08 DIAGNOSIS — M6281 Muscle weakness (generalized): Secondary | ICD-10-CM | POA: Diagnosis not present

## 2024-01-15 DIAGNOSIS — M25661 Stiffness of right knee, not elsewhere classified: Secondary | ICD-10-CM | POA: Diagnosis not present

## 2024-01-15 DIAGNOSIS — M25561 Pain in right knee: Secondary | ICD-10-CM | POA: Diagnosis not present

## 2024-01-15 DIAGNOSIS — M6281 Muscle weakness (generalized): Secondary | ICD-10-CM | POA: Diagnosis not present

## 2024-01-16 ENCOUNTER — Other Ambulatory Visit (HOSPITAL_BASED_OUTPATIENT_CLINIC_OR_DEPARTMENT_OTHER): Payer: Self-pay

## 2024-01-16 DIAGNOSIS — Z23 Encounter for immunization: Secondary | ICD-10-CM | POA: Diagnosis not present

## 2024-01-16 MED ORDER — FLUZONE HIGH-DOSE 0.5 ML IM SUSY
0.5000 mL | PREFILLED_SYRINGE | Freq: Once | INTRAMUSCULAR | 0 refills | Status: AC
  Filled 2024-01-16: qty 0.5, 1d supply, fill #0

## 2024-01-19 ENCOUNTER — Encounter: Payer: Self-pay | Admitting: Radiology

## 2024-01-22 DIAGNOSIS — E782 Mixed hyperlipidemia: Secondary | ICD-10-CM | POA: Diagnosis not present

## 2024-01-22 DIAGNOSIS — R7303 Prediabetes: Secondary | ICD-10-CM | POA: Diagnosis not present

## 2024-01-29 DIAGNOSIS — Z0000 Encounter for general adult medical examination without abnormal findings: Secondary | ICD-10-CM | POA: Diagnosis not present

## 2024-01-29 DIAGNOSIS — R7303 Prediabetes: Secondary | ICD-10-CM | POA: Diagnosis not present

## 2024-01-29 DIAGNOSIS — Z6831 Body mass index (BMI) 31.0-31.9, adult: Secondary | ICD-10-CM | POA: Diagnosis not present

## 2024-01-29 DIAGNOSIS — R748 Abnormal levels of other serum enzymes: Secondary | ICD-10-CM | POA: Diagnosis not present

## 2024-01-29 DIAGNOSIS — E782 Mixed hyperlipidemia: Secondary | ICD-10-CM | POA: Diagnosis not present

## 2024-01-29 DIAGNOSIS — E669 Obesity, unspecified: Secondary | ICD-10-CM | POA: Diagnosis not present

## 2024-01-29 DIAGNOSIS — I10 Essential (primary) hypertension: Secondary | ICD-10-CM | POA: Diagnosis not present

## 2024-02-24 DIAGNOSIS — M1712 Unilateral primary osteoarthritis, left knee: Secondary | ICD-10-CM | POA: Diagnosis not present

## 2024-02-24 DIAGNOSIS — G8918 Other acute postprocedural pain: Secondary | ICD-10-CM | POA: Diagnosis not present

## 2024-02-26 DIAGNOSIS — M6281 Muscle weakness (generalized): Secondary | ICD-10-CM | POA: Diagnosis not present

## 2024-02-26 DIAGNOSIS — M25562 Pain in left knee: Secondary | ICD-10-CM | POA: Diagnosis not present

## 2024-02-26 DIAGNOSIS — M25662 Stiffness of left knee, not elsewhere classified: Secondary | ICD-10-CM | POA: Diagnosis not present

## 2024-03-01 DIAGNOSIS — M25662 Stiffness of left knee, not elsewhere classified: Secondary | ICD-10-CM | POA: Diagnosis not present

## 2024-03-01 DIAGNOSIS — M6281 Muscle weakness (generalized): Secondary | ICD-10-CM | POA: Diagnosis not present

## 2024-03-01 DIAGNOSIS — M25562 Pain in left knee: Secondary | ICD-10-CM | POA: Diagnosis not present
